# Patient Record
Sex: Female | Born: 1965 | Race: White | Hispanic: No | Smoking: Never smoker
Health system: Southern US, Community
[De-identification: ages and names within clinical notes are randomized; demographics above are authoritative.]

## PROBLEM LIST (undated history)

## (undated) DIAGNOSIS — F419 Anxiety disorder, unspecified: Secondary | ICD-10-CM

## (undated) DIAGNOSIS — E119 Type 2 diabetes mellitus without complications: Secondary | ICD-10-CM

## (undated) DIAGNOSIS — Z0000 Encounter for general adult medical examination without abnormal findings: Secondary | ICD-10-CM

## (undated) DIAGNOSIS — M810 Age-related osteoporosis without current pathological fracture: Secondary | ICD-10-CM

## (undated) DIAGNOSIS — R109 Unspecified abdominal pain: Secondary | ICD-10-CM

## (undated) DIAGNOSIS — R21 Rash and other nonspecific skin eruption: Secondary | ICD-10-CM

## (undated) DIAGNOSIS — J209 Acute bronchitis, unspecified: Secondary | ICD-10-CM

## (undated) DIAGNOSIS — T7840XA Allergy, unspecified, initial encounter: Secondary | ICD-10-CM

## (undated) DIAGNOSIS — E785 Hyperlipidemia, unspecified: Secondary | ICD-10-CM

## (undated) DIAGNOSIS — E039 Hypothyroidism, unspecified: Secondary | ICD-10-CM

## (undated) HISTORY — DX: Age-related osteoporosis without current pathological fracture: M81.0

## (undated) HISTORY — DX: Encounter for general adult medical examination without abnormal findings: Z00.00

## (undated) HISTORY — DX: Hypothyroidism, unspecified: E03.9

## (undated) HISTORY — DX: Hyperlipidemia, unspecified: E78.5

## (undated) HISTORY — DX: Type 2 diabetes mellitus without complications: E11.9

## (undated) HISTORY — DX: Acute bronchitis, unspecified: J20.9

## (undated) HISTORY — DX: Rash and other nonspecific skin eruption: R21

## (undated) HISTORY — DX: Allergy, unspecified, initial encounter: T78.40XA

## (undated) HISTORY — DX: Anxiety disorder, unspecified: F41.9

## (undated) HISTORY — DX: Unspecified abdominal pain: R10.9

## (undated) HISTORY — PX: NO PAST SURGERIES: SHX2092

---

## 2008-08-30 ENCOUNTER — Encounter: Admission: RE | Admit: 2008-08-30 | Discharge: 2008-08-30 | Payer: Self-pay | Admitting: Endocrinology

## 2009-04-30 ENCOUNTER — Encounter: Payer: Self-pay | Admitting: Family Medicine

## 2009-12-05 ENCOUNTER — Ambulatory Visit: Payer: Self-pay | Admitting: Family Medicine

## 2009-12-05 DIAGNOSIS — L989 Disorder of the skin and subcutaneous tissue, unspecified: Secondary | ICD-10-CM | POA: Insufficient documentation

## 2009-12-05 DIAGNOSIS — Z780 Asymptomatic menopausal state: Secondary | ICD-10-CM | POA: Insufficient documentation

## 2009-12-05 DIAGNOSIS — E559 Vitamin D deficiency, unspecified: Secondary | ICD-10-CM | POA: Insufficient documentation

## 2009-12-05 DIAGNOSIS — M4120 Other idiopathic scoliosis, site unspecified: Secondary | ICD-10-CM | POA: Insufficient documentation

## 2009-12-05 DIAGNOSIS — M810 Age-related osteoporosis without current pathological fracture: Secondary | ICD-10-CM | POA: Insufficient documentation

## 2009-12-05 DIAGNOSIS — T7840XA Allergy, unspecified, initial encounter: Secondary | ICD-10-CM | POA: Insufficient documentation

## 2009-12-05 DIAGNOSIS — R110 Nausea: Secondary | ICD-10-CM | POA: Insufficient documentation

## 2009-12-05 DIAGNOSIS — R1032 Left lower quadrant pain: Secondary | ICD-10-CM | POA: Insufficient documentation

## 2009-12-05 DIAGNOSIS — E782 Mixed hyperlipidemia: Secondary | ICD-10-CM | POA: Insufficient documentation

## 2009-12-05 DIAGNOSIS — E119 Type 2 diabetes mellitus without complications: Secondary | ICD-10-CM | POA: Insufficient documentation

## 2009-12-05 HISTORY — DX: Age-related osteoporosis without current pathological fracture: M81.0

## 2009-12-06 ENCOUNTER — Encounter: Admission: RE | Admit: 2009-12-06 | Discharge: 2009-12-06 | Payer: Self-pay | Admitting: Family Medicine

## 2010-02-05 ENCOUNTER — Ambulatory Visit: Payer: Self-pay | Admitting: Family Medicine

## 2010-02-26 ENCOUNTER — Encounter: Payer: Self-pay | Admitting: Family Medicine

## 2010-02-26 ENCOUNTER — Encounter
Admission: RE | Admit: 2010-02-26 | Discharge: 2010-02-26 | Payer: Self-pay | Source: Home / Self Care | Attending: Family Medicine | Admitting: Family Medicine

## 2010-02-26 LAB — HM MAMMOGRAPHY

## 2010-03-14 ENCOUNTER — Telehealth (INDEPENDENT_AMBULATORY_CARE_PROVIDER_SITE_OTHER): Payer: Self-pay | Admitting: *Deleted

## 2010-03-15 ENCOUNTER — Encounter: Payer: Self-pay | Admitting: Family Medicine

## 2010-03-19 ENCOUNTER — Encounter: Payer: Self-pay | Admitting: Family Medicine

## 2010-03-19 NOTE — Letter (Signed)
Summary: Records from [facility] 2010 - 2011  Records from [facility] 2010 - 2011   Imported By: Maryln Gottron 12/31/2009 14:18:41  _____________________________________________________________________  External Attachment:    Type:   Image     Comment:   External Document

## 2010-03-19 NOTE — Assessment & Plan Note (Signed)
Summary: to be est/abd pain/njr   Vital Signs:  Patient profile:   46 year old female Height:      61 inches Weight:      104 pounds BMI:     19.72 O2 Sat:      98 % Temp:     98.2 degrees F oral Pulse rate:   86 / minute BP sitting:   104 / 60  (left arm)  Vitals Entered By: g hudy rn CC: c/o LLQ pain denies constipation no diarrhea onset 1 month ago    History of Present Illness: Patient in today for new patient appt. Her major complaint today is LLQ pain. he has a 45 year old nonsmoking Caucasian female who has been having in the left lower quadrant for roughly one month. She reports the pain waxed and waned it resolves it is very low in the leftquadrant she denies any recent change in bowel habits. Moves her bowels daily, formed and comfortably. Stool are brown and unchanged, pain does not change with defecation. Only urinary symptom is change in urine color to almost light greenish, no dysuria, hematuria, frequency or urgency. she reports a long history of intermittent cyclic left lower quadrant pain often noting its worse in the middle of the month in between periods. He sees an OB/GYN was not having this persistent pain when she saw him last in May. At times the pain goes to 10 out of 10 and today is at 5. Position changes do not affect it. No radicular symptoms down the leg and no back pain. No recent illness, fevers, chills, CP, SOB. She denies any knowledge of having numerous symptoms consistent with perimenopause, including night sweats, palpitations, mood swings, insomnia, forgetfulness. Reports a recent [facility] was 47.4 but she is unclear what time of the month at once. In her early 54s she was diagnosed with multiplemedical problems including diabetes unclear if it is type I or type II she is tight control. She does see an endocrinologist. She has hyperlipidemia and has been started on Zocor. She is bad allergies and recently started allergy shots of malignancies are helping. She has  scoliosis in her neck and has chronic neck pain. Osteopenia was diagnosed and she is now 4  Preventive Screening-Counseling & Management  Alcohol-Tobacco     Smoking Status: never  Caffeine-Diet-Exercise     Does Patient Exercise: yes  Allergies (verified): 1)  ! Pcn 2)  ! 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  The patient denies anorexia, fever, weight loss, weight gain, vision loss, decreased hearing, hoarseness, chest pain, syncope, dyspnea on exertion, peripheral edema, prolonged cough, headaches, hemoptysis, melena, hematochezia, severe indigestion/heartburn, hematuria, incontinence, genital sores, muscle weakness, suspicious skin lesions, transient blindness, difficulty walking, depression, unusual weight change, abnormal bleeding, enlarged lymph nodes, and breast masses.  Physical Exam  General:  Well-developed,well-nourished,in no acute distress; alert,appropriate and cooperative throughout examination Head:  Normocephalic and atraumatic without obvious abnormalities. No apparent alopecia or balding. Eyes:  No corneal or conjunctival inflammation noted. EOMI. Perrla.  Ears:   External ear exam shows no significant lesions or deformities.  Otoscopic examination reveals clear canals, tympanic membranes are intact bilaterally without bulging, retraction, inflammation or discharge. Hearing is grossly normal bilaterally. Nose:  External nasal examination shows no deformity or inflammation. Nasal mucosa are pink and moist without lesions or exudates. Mouth:  Oral mucosa and oropharynx without lesions or exudates.  Teeth in good repair. Neck:  No deformities, masses, or tenderness noted. Lungs:  Normal respiratory effort, chest expands symmetrically. Lungs are clear to auscultation, no crackles or wheezes. Heart:  Normal rate and regular rhythm. S1 and S2 normal without gallop, murmur, click, rub or other extra sounds. Abdomen:  Bowel sounds positive,abdomen soft and organomegaly or hernias noted.no guarding, no rigidity, no rebound tenderness, and no inguinal hernia.  Is mildly tender to palpation in very lower area of left lower quadrant, feel some mild tension noted in a superficial muscle in this region, no discreet mass noted Msk:  No deformity noted of thoracic or lumbar spine.   Pulses:  R and L carotid, dorsalis pedis and posterior tibial pulses are full and equal bilaterally Extremities:  No clubbing, cyanosis, edema, or deformity noted with normal full range of motion of all joints.   Neurologic:  No cranial nerve deficits noted. Station and gait are normal. Plantar reflexes are down-going bilaterally. DTRs are symmetrical throughout. Sensory, motor and coordinative functions appear intact. Skin:  Intact without suspicious lesions or rashes. Plenty of freckles and benign appearing skin lesions Cervical Nodes:  No lymphadenopathy noted Psych:  Cognition and judgment appear intact. Alert and cooperative with normal attention span and concentration. No apparent delusions, illusions, hallucinations   Impression & Recommendations:  Problem # 1:  ABDOMINAL PAIN, LEFT LOWER  QUADRANT (ICD-789.04)  Orders: UA Dipstick w/o Micro (automated)  (81003) Radiology Referral (Radiology) Will proceed with LLQ [place], r/o ovarian pathology, apply heat and or ice for pain relief, may apply Aspercreme as needed and OTC Aleve 220mg  by mouth two times a day for next couple of weeks and then as needed with food, if pain does not resolve will proceed with further imaging including a left hip xray  Problem # 2:  DIABETES MELLITUS (ICD-250.00) Diet controlled, is following with Endocrinology and will continue to do so but will request records just to verify which labs they order routinely so we can monitor CBC, hepatic panel etc if not routinely ordered. No changes recommended at today's visit  Problem # 3:  OSTEOPENIA (ICD-733.90)  Her updated medication list for this problem includes:    Actonel 35 Mg Tabs (Risedronate sodium) .Marland Kitchen... Weekly dr Lenord Carbo Is maintained on this by her OB/GYN consider a switch from Caltrate to Citracal for better absorption  Problem # 4:  ALLERGY (ICD-995.3) Receiving routine allergy shots at present and does believe these are helping.  Problem # 5:  PERIMENOPAUSAL STATUS (ICD-V49.81) Encouraged 8 hour sleep, maintain exercise, do not skip meals or eat excessive carbs, start Fish oil 1 cap daily and consider Borage Oil if symptoms do not improve. Patient is considering Bioidentical hormones, should discuss with gynecologist. May consider Black Cohosh supplement  Problem # 6:  SKIN LESIONS, MULTIPLE (ICD-709.9) All benign appearing but she already has an appt for annual skin mapping with a Dermatologist  due to her family history of melanoma and history of sun exposure.  Problem # 7:  SCOLIOSIS (ICD-737.30) Neck pain, try moist heat and gentle stretching  Complete Medication List: 1)  Actonel 35 Mg Tabs (Risedronate sodium) .... Weekly dr Lenord Carbo 2)  Zocor 20 Mg Tabs (Simvastatin) 3)  Loestrin 1.5/30 (21) 1.5-30 Mg-mcg Tabs (Norethindrone acet-ethinyl  est) 4)  Levocetirize  5)  Estrace 0.1 Mg/gm Crea (Estradiol)  Patient Instructions: 1)  Please schedule a follow-up appointment in 2 months.  2)  For left lower quadrant pain, try moist heat and massage with Aspercreme at least daily if not two times a day  3)  Take Aleve 220mg  by mouth two times a day over the next 2 weeks. 4)  Release of Record, Dr  Horald Pollen, Endocrinology 5)  If Pelvic US unremarkable and pain continues call in roughly 2 weeks and we will send you for an xray   Orders Added: 1)  UA Dipstick w/o Micro (automated)  [81003] 2)  Radiology Referral [Radiology] 3)  New Patient Level IV [16109]  Appended Document: Orders Update    Clinical Lists Changes  Observations: Added new observation of COMMENTS: Wynona Canes, CMA  December 05, 2009 3:05 PM  (12/05/2009 15:04) Added new observation of PH URINE: 6.0  (12/05/2009 15:04) Added new observation of SPEC GR URIN: 1.010  (12/05/2009 15:04) Added new observation of APPEARANCE U: Clear  (12/05/2009 15:04) Added new observation of UA COLOR: yellow  (12/05/2009 15:04) Added new observation of WBC DIPSTK U: negative  (12/05/2009 15:04) Added new observation of NITRITE URN: negative  (12/05/2009 15:04) Added new observation of UROBILINOGEN: 0.2  (12/05/2009 15:04) Added new observation of PROTEIN, URN: negative  (12/05/2009 15:04) Added new observation of BLOOD UR DIP: negative  (12/05/2009 15:04) Added new observation of KETONES URN: negative  (12/05/2009 15:04) Added new observation of BILIRUBIN UR: negative  (12/05/2009 15:04) Added new observation of GLUCOSE, URN: negative  (12/05/2009 15:04)      Laboratory Results   Urine Tests  Date/Time Recieved: December 05, 2009 3:05 PM  Date/Time Reported: December 05, 2009 3:04 PM   Routine Urinalysis   Color: yellow Appearance: Clear Glucose: negative   (Normal Range: Negative) Bilirubin: negative   (Normal Range: Negative) Ketone: negative   (Normal Range:  Negative) Spec. Gravity: 1.010   (Normal Range: 1.003-1.035) Blood: negative   (Normal Range: Negative) pH: 6.0   (Normal Range: 5.0-8.0) Protein: negative   (Normal Range: Negative) Urobilinogen: 0.2   (Normal Range: 0-1) Nitrite: negative   (Normal Range: Negative) Leukocyte Esterace: negative   (Normal Range: Negative)    Comments: Wynona Canes, CMA  December 05, 2009 3:05 PM    Notify neg

## 2010-03-21 ENCOUNTER — Encounter: Payer: Self-pay | Admitting: Family Medicine

## 2010-03-21 NOTE — Progress Notes (Signed)
Summary: Actonel question  Phone Note Call from Patient Call back at Work Phone [phone]   Caller: Patient Reason for Call: Talk to Nurse Summary of Call: Pt has question about actonel, can reach her after 4PM Initial call taken by: Lannette Donath,  March 14, 2010 4:06 PM  Follow-up for Phone Call        Will call pt back. Have been trying to reach pt in previous note about the Actonel. Follow-up by: Josph Macho RMA,  March 15, 2010 8:28 AM

## 2010-03-21 NOTE — Assessment & Plan Note (Signed)
Summary: 2 month f/u//alp rsch from bmp/dt   Vital Signs:  Patient profile:   45 year old female Height:      61 inches (154.94 cm) Weight:      104.50 pounds (47.50 kg) O2 Sat:      99 % on Room air Temp:     97.9 degrees F (36.61 degrees C) oral Pulse rate:   73 / minute BP sitting:   106 / 69  (right arm) Cuff size:   regular  Vitals Entered By: Josph Macho RMA (February 05, 2010 10:28 AM)  O2 Flow:  Room air CC: 2 month follow up/ CF Is Patient Diabetic? Yes   History of Present Illness: Patient a 89 she'll female in today for follow up on a new patient appointment. She reports doing well. She has had no recent illness, fevers, congestion, chest pain, palpitations, shortness of breath, GI or GU concerns. She's recently seen her endocrinologist and her hemoglobin A1c was 5.0. She does not believe they checked her cholesterol recently but does believe her scheduled blood work next year will include a cholesterol. She is in need of a bone densitometry and mammogram I would like him to be order to breathe imaging this year is that of having to return to [facility] she [facility] she had her ultrasound on him like to return there. She's not had any persistent abdominal pain she does occasionally still have some intermittent left hip pain. She denies any pattern to the pain no particular movements or activities and to bring it on no particular time of the month no change in bowel habits as noted. She does note she has some intermittent radicular symptoms with a burning sensation down her lateral left hip at times into the lateral aspect of her thigh. She described as burning with the massage it resolves. She denies any low back pain. Does have ongoing trouble with her cervical spine for long history of scoliosis and chronic neck pain but was in physical therapy her neck is relatively immobile and the pain is tolerable  at this time.  Current Medications (verified): 1)  Actonel 35 Mg Tabs (Risedronate Sodium) .... Weekly Dr Lenord Carbo 2)  Zocor 20 Mg Tabs (Simvastatin) 3)  Loestrin 1.5/30 (21) 1.5-30 Mg-Mcg Tabs (Norethindrone Acet-Ethinyl Est) 4)  Levocetirize 5)  Estrace 0.1 Mg/gm Crea (Estradiol)  Allergies (verified): 1)  ! Pcn 2)  ! Codeine  Past History:  Past medical history reviewed for relevance to current acute and chronic problems. Social history (including risk factors) reviewed for relevance to current acute and chronic problems.  Social History: Reviewed history from 12/05/2009 and no changes required. 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 No apparent alopecia or balding. Mouth:  Oral mucosa and oropharynx without lesions or exudates.  Teeth in good repair. Neck:  No deformities, masses, or tenderness noted. Lungs:  Normal respiratory effort, chest expands symmetrically. Lungs are clear to auscultation, no crackles or wheezes. Heart:  Normal rate and regular rhythm. S1 and S2 normal without gallop, murmur, click, rub or other extra sounds. Abdomen:  Bowel sounds  positive,abdomen soft and non-tender without masses, organomegaly or hernias noted. Extremities:  No clubbing, cyanosis, edema, or deformity noted with normal full range of motion of all joints.   Cervical Nodes:  No lymphadenopathy noted Psych:  Cognition and judgment appear intact. Alert and cooperative with normal attention span and concentration. No apparent delusions, illusions,  hallucinations   Impression & Recommendations:  Problem # 1:  ABDOMINAL PAIN, LEFT LOWER QUADRANT (ICD-789.04) Improved, occuring much less frequently and responding to Ibuprofen as needed when it occurs. Pain more suggestive of musculoskeletal dysfunction than abdominal. She will call if pain worsens for futher imaging and possible referral. Pelvic US unremarkable  Problem # 2:  MIXED HYPERLIPIDEMIA (ICD-272.2)  Her updated medication list for this problem includes:    Zocor 20 Mg Tabs (Simvastatin) Is tolerating Zocor and she believes she has an appt to have cholesterol checked already next year. She will call if this does not get performed. Avoid trans fats, increase exercise. Reviewed last FLP with patient  Problem # 3:  OSTEOPENIA (ICD-733.90)  Her updated medication list for this problem includes:    Actonel 35 Mg Tabs (Risedronate sodium) .Marland Kitchen... Weekly dr Suzan Nailer densitometry ordered today, will have Vit D level checked at her Endocrinologists office, will have results forwarded here.  Orders: Radiology Referral (Radiology)  Problem # 4:  DIABETES MELLITUS (ICD-250.00) Patient reports HGBA1C 5.0 recently, patient agrees to continue same dietary regimen  Problem # 5:  Screening Breast Cancer (ICD-V76.10) Patient requesting that her screening MGM be completed at the [facility] in [place], written prescription provided  Complete Medication List: 1)  Actonel 35 Mg Tabs (Risedronate sodium) .... Weekly dr Lenord Carbo 2)  Zocor 20 Mg Tabs (Simvastatin) 3)  Loestrin 1.5/30 (21) 1.5-30 Mg-mcg Tabs (Norethindrone acet-ethinyl est) 4)  Levocetirize  5)  Estrace 0.1 Mg/gm Crea (Estradiol)  Patient Instructions: 1)  Please schedule a follow-up appointment in 10 months for annual exam. Call with any concerns   Orders Added: 1)  Radiology Referral [Radiology] 2)  Est. Patient Level IV [95621]    Preventive Care Screening  Last Flu Shot:    Date:  01/17/2010    Results:   historical

## 2010-03-27 NOTE — Letter (Signed)
Summary: Generic Letter  [place] [facility]  [address]   [place], [place] [address]   Phone: [phone]  Fax: [phone]    03/21/2010    Dalene Carrow [address] [place], [place]  [address]  Dear Ms. Amalia Hailey,   Our office is having trouble contacting you about some questions you had concerning your labs. Will you please call our office at your earliest convenience [phone].      Sincerely,   Josph Macho RMA

## 2010-04-25 NOTE — Letter (Signed)
Summary: [facility] MD  [facility] MD   Imported By: Lester Newport 04/19/2010 07:47:10  _____________________________________________________________________  External Attachment:    Type:   Image     Comment:   External Document

## 2010-07-16 ENCOUNTER — Ambulatory Visit (INDEPENDENT_AMBULATORY_CARE_PROVIDER_SITE_OTHER): Payer: 59 | Admitting: Family Medicine

## 2010-07-16 ENCOUNTER — Encounter: Payer: Self-pay | Admitting: Family Medicine

## 2010-07-16 VITALS — BP 102/66 | HR 75 | Temp 98.0°F | Ht 61.0 in | Wt 104.0 lb

## 2010-07-16 DIAGNOSIS — N390 Urinary tract infection, site not specified: Secondary | ICD-10-CM | POA: Insufficient documentation

## 2010-07-16 DIAGNOSIS — R300 Dysuria: Secondary | ICD-10-CM

## 2010-07-16 LAB — POCT URINALYSIS DIPSTICK
Glucose, UA: NEGATIVE
Ketones, UA: 80
Nitrite, UA: NEGATIVE
Protein, UA: 30
Spec Grav, UA: 1.03
Urobilinogen, UA: 0.2
pH, UA: 5.5

## 2010-07-16 MED ORDER — CIPROFLOXACIN HCL 500 MG PO TABS: 500.0000 mg | ORAL_TABLET | Freq: Two times a day (BID) | ORAL | Status: AC

## 2010-07-16 NOTE — Assessment & Plan Note (Signed)
Start cipro 500mg  bid x 5d. Sent urine for c/s. Discussed prn use of OTC AZO over the next 1-2 days. Call or return if not showing improvement in 2d.

## 2010-07-16 NOTE — Progress Notes (Signed)
OFFICE NOTE  07/16/2010  CC:  Chief Complaint  Patient presents with  . Urinary Tract Infection    urinary burning, bloody urine, urgency, frequency     HPI:   Patient is a 45 y.o. Caucasian female who is here for UTI sx's. Reports 2-3 days of persistent dysuria, urinary urgency and frequency, and intermittent gross hematuria.   No fever, no abd pain, no nausea, no flank pain.  No meds taken for these symptoms. Says last UTI was probably > 5 yrs ago.  Pertinent PMH:  DM 2, diet controlled Perimenopausal syndrome Osteopenia  MEDS;   Outpatient Prescriptions Prior to Visit  Medication Sig Dispense Refill  . estradiol (ESTRACE) 0.1 MG/GM vaginal cream Place 2 g vaginally daily.        . risedronate (ACTONEL) 35 MG tablet Take 35 mg by mouth once a week. with water on empty stomach, nothing by mouth or lie down for next 30 minutes.         PE: Blood pressure 102/66, pulse 75, temperature 98 F (36.7 C), temperature source Oral, height 5\' 1"  (1.549 m), weight 104 lb (47.174 kg), last menstrual period 07/02/2010. Gen: Alert, well appearing.  Patient is oriented to person, place, time, and situation. Chest: symmetric expansion, nonlabored respirations.  Clear and equal breath sounds in all lung fields.   CV: RRR, no m/r/g.   LAB: CC UA today showed some leukocytes and blood, otherwise normal.  IMPRESSION AND PLAN:  UTI (lower urinary tract infection) Start cipro 500mg  bid x 5d. Sent urine for c/s. Discussed prn use of OTC AZO over the next 1-2 days. Call or return if not showing improvement in 2d.     FOLLOW UP:  Return if symptoms worsen or fail to improve.

## 2010-07-19 LAB — URINE CULTURE: Colony Count: >=100000

## 2010-12-11 ENCOUNTER — Encounter: Payer: Self-pay | Admitting: Family Medicine

## 2010-12-11 ENCOUNTER — Ambulatory Visit (INDEPENDENT_AMBULATORY_CARE_PROVIDER_SITE_OTHER): Payer: 59 | Admitting: Family Medicine

## 2010-12-11 VITALS — BP 120/75 | HR 74 | Temp 97.9°F | Ht 61.0 in | Wt 107.1 lb

## 2010-12-11 DIAGNOSIS — J209 Acute bronchitis, unspecified: Secondary | ICD-10-CM

## 2010-12-11 DIAGNOSIS — E119 Type 2 diabetes mellitus without complications: Secondary | ICD-10-CM

## 2010-12-11 DIAGNOSIS — J40 Bronchitis, not specified as acute or chronic: Secondary | ICD-10-CM

## 2010-12-11 DIAGNOSIS — E782 Mixed hyperlipidemia: Secondary | ICD-10-CM

## 2010-12-11 MED ORDER — ALBUTEROL SULFATE HFA 108 (90 BASE) MCG/ACT IN AERS: 2.0000 | INHALATION_SPRAY | Freq: Four times a day (QID) | RESPIRATORY_TRACT | Status: DC | PRN

## 2010-12-11 MED ORDER — METHYLPREDNISOLONE 4 MG PO KIT: PACK | ORAL | Status: DC

## 2010-12-11 MED ORDER — CEFDINIR 300 MG PO CAPS: 300.0000 mg | ORAL_CAPSULE | Freq: Two times a day (BID) | ORAL | Status: AC

## 2010-12-11 MED ORDER — HYDROCOD POLST-CHLORPHEN POLST 10-8 MG/5ML PO LQCR: 5.0000 mL | Freq: Every evening | ORAL | Status: DC | PRN

## 2010-12-11 NOTE — Patient Instructions (Signed)

## 2010-12-15 ENCOUNTER — Encounter: Payer: Self-pay | Admitting: Family Medicine

## 2010-12-15 DIAGNOSIS — J209 Acute bronchitis, unspecified: Secondary | ICD-10-CM

## 2010-12-15 HISTORY — DX: Acute bronchitis, unspecified: J20.9

## 2010-12-15 NOTE — Assessment & Plan Note (Signed)
No recent concerning symptoms. 

## 2010-12-15 NOTE — Progress Notes (Signed)
Kelli Barry [id number] 05/16/1965 12/15/2010      Progress Note-Follow Up  Subjective  Chief Complaint  Chief Complaint  Patient presents with  . Nasal Congestion    X 6 weeks- moved into chest  . difficulty breathing    HPI  Patient is a 45 year old Caucasian female who is in today with 6 or more weeks of ongoing respiratory symptoms she started with a sore throat and quickly developed headache and nasal congestion. She managed her symptoms for several weeks and then eventually some chills, malaise, myalgias, fatigue worsened. Cough became bad enough that is now keeping her up at night. Generally dry occasionally productive he'll sputum. She notes postnasal drip and chills have also developed. She feels short of breath and easily winded. She has tried some Mucinex over-the-counter with minimal results.  Past Medical History  Diagnosis Date  . Acute bronchitis 12/15/2010    History reviewed. No pertinent past surgical history.  Family History  Problem Relation Age of Onset  . Diabetes Father     DM II  . Hypertension Father   . Melanoma Father   . Hypertension Mother   . Fibroids Mother     uterine  . Ovarian cysts Sister   . Cancer Maternal Grandmother     colon  . Coronary artery disease Maternal Grandmother   . Heart attack Maternal Grandmother   . Heart disease Maternal Grandfather   . Stroke Paternal Grandmother   . Dementia Paternal Grandmother   . Cancer Paternal Grandfather     Lung  . Emphysema Paternal Grandfather   . Allergies Son     History   Social History  . Marital Status: Married    Spouse Name: N/A    Number of Children: N/A  . Years of Education: N/A   Occupational History  . Not on file.   Social History Main Topics  . Smoking status: Never Smoker   . Smokeless tobacco: Never Used  . Alcohol Use: 0.0 oz/week    0 drink(s) per week  . Drug Use: Not on file  . Sexually Active: Not on file   Other Topics Concern  . Not on file     Social History Narrative  . No narrative on file    Current Outpatient Prescriptions on File Prior to Visit  Medication Sig Dispense Refill  . estradiol (ESTRACE) 0.1 MG/GM vaginal cream Place 2 g vaginally daily.        Marland Kitchen levocetirizine (XYZAL) 5 MG tablet Take 5 mg by mouth every evening.        . Norethin Ace-Eth Estrad-FE (LOESTRIN FE 1/20 PO) Take 1 tablet by mouth daily.        . risedronate (ACTONEL) 35 MG tablet Take 35 mg by mouth once a week. with water on empty stomach, nothing by mouth or lie down for next 30 minutes.       . simvastatin (ZOCOR) 20 MG tablet Take 20 mg by mouth at bedtime.          Allergies  Allergen Reactions  . Codeine   . Penicillins     Review of Systems  Review of Systems  Constitutional: Positive for malaise/fatigue. Negative for fever and chills.  HENT: Positive for congestion and sore throat. Negative for hearing loss and nosebleeds.   Eyes: Negative for discharge.  Respiratory: Positive for cough, sputum production, shortness of breath and wheezing.   Cardiovascular: Negative for chest pain, palpitations, orthopnea and leg swelling.  Gastrointestinal: Negative for heartburn,  nausea, vomiting, abdominal pain, diarrhea, constipation and blood in stool.  Genitourinary: Negative for dysuria, urgency, frequency and hematuria.  Musculoskeletal: Positive for myalgias. Negative for back pain and falls.  Skin: Negative for rash.  Neurological: Positive for headaches. Negative for dizziness, tremors, sensory change, focal weakness, loss of consciousness and weakness.  Endo/Heme/Allergies: Negative for polydipsia. Does not bruise/bleed easily.  Psychiatric/Behavioral: Negative for depression and suicidal ideas. The patient is not nervous/anxious and does not have insomnia.     Objective  BP 120/75  Pulse 74  Temp(Src) 97.9 F (36.6 C) (Oral)  Ht 5\' 1"  (1.549 m)  Wt 107 lb 1.9 oz (48.589 kg)  BMI 20.24 kg/m2  SpO2 99%  LMP  11/13/2010  Physical Exam  Physical Exam  Constitutional: She is oriented to person, place, and time and well-developed, well-nourished, and in no distress. No distress.  HENT:  Head: Normocephalic and atraumatic.       Nasal mucosa boggy and erythematous  Eyes: Conjunctivae are normal.  Neck: Neck supple. No thyromegaly present.  Cardiovascular: Normal rate, regular rhythm and normal heart sounds.   No murmur heard. Pulmonary/Chest: Effort normal and breath sounds normal. She has no wheezes. She exhibits no tenderness.       Decreased breath sounds b/l bases  Abdominal: She exhibits no distension and no mass.  Musculoskeletal: She exhibits no edema.  Lymphadenopathy:    She has no cervical adenopathy.  Neurological: She is alert and oriented to person, place, and time. No cranial nerve deficit.  Skin: Skin is warm and dry. No rash noted. She is not diaphoretic.  Psychiatric: Memory, affect and judgment normal.     Assessment & Plan  DIABETES MELLITUS No recent concerning symptoms  MIXED HYPERLIPIDEMIA Tolerating Simvastatin, avoid trans fats  Acute bronchitis Increase rest and hydration, Start Cefdinir, Medrol dose pak and Albuterol  prn    2

## 2010-12-15 NOTE — Assessment & Plan Note (Signed)
Increase rest and hydration, Start Cefdinir, Medrol dose pak and Albuterol  prn

## 2010-12-15 NOTE — Assessment & Plan Note (Signed)
Tolerating Simvastatin, avoid trans fats.  

## 2010-12-17 ENCOUNTER — Ambulatory Visit (INDEPENDENT_AMBULATORY_CARE_PROVIDER_SITE_OTHER): Payer: 59 | Admitting: Family Medicine

## 2010-12-17 ENCOUNTER — Encounter: Payer: Self-pay | Admitting: Family Medicine

## 2010-12-17 ENCOUNTER — Ambulatory Visit (HOSPITAL_BASED_OUTPATIENT_CLINIC_OR_DEPARTMENT_OTHER)
Admission: RE | Admit: 2010-12-17 | Discharge: 2010-12-17 | Disposition: A | Discharge status: Home / Self Care | Payer: 59 | Source: Ambulatory Visit | Attending: Family Medicine | Admitting: Family Medicine

## 2010-12-17 DIAGNOSIS — J209 Acute bronchitis, unspecified: Secondary | ICD-10-CM

## 2010-12-17 DIAGNOSIS — J40 Bronchitis, not specified as acute or chronic: Secondary | ICD-10-CM

## 2010-12-17 DIAGNOSIS — R0602 Shortness of breath: Secondary | ICD-10-CM

## 2010-12-17 DIAGNOSIS — R059 Cough, unspecified: Secondary | ICD-10-CM

## 2010-12-17 DIAGNOSIS — R05 Cough: Secondary | ICD-10-CM | POA: Insufficient documentation

## 2010-12-17 MED ORDER — FLUTICASONE-SALMETEROL 45-21 MCG/ACT IN AERO: 1.0000 | INHALATION_SPRAY | Freq: Two times a day (BID) | RESPIRATORY_TRACT | Status: DC

## 2010-12-17 NOTE — Patient Instructions (Signed)

## 2010-12-17 NOTE — Progress Notes (Signed)
Lorriane Dehart [id number] 1965-03-15 12/17/2010      Progress Note-Follow Up  Subjective  Chief Complaint  Chief Complaint  Patient presents with  . Fatigue    very tired still  . Shortness of Breath    hard to catch breath  . Cough    w/ phlegm (clear and sometimes yellow)    HPI  Patient is a 45 year old Caucasian female who is in today with persistent cough. Last week she was seen with acute bronchitis, cough, fatigue, malaise, sputum. Was started on Omnicef, and insulin and steroids. The steroids have been completed but she continues to cough have fatigue and sputum. Albuterol does give relief and allows her to sleep. She is hepatitis next 2 to concerns about sedation. No fevers, chills, headache, ear pain. Has a mild scratchy throat. She has chest wall pain when she coughs but no other time. No palpitations or wheezing. Does have some mild shortness of breath. NO GI or GU c/o  Past Medical History  Diagnosis Date  . Acute bronchitis 12/15/2010    No past surgical history on file.  Family History  Problem Relation Age of Onset  . Diabetes Father     DM II  . Hypertension Father   . Melanoma Father   . Hypertension Mother   . Fibroids Mother     uterine  . Ovarian cysts Sister   . Cancer Maternal Grandmother     colon  . Coronary artery disease Maternal Grandmother   . Heart attack Maternal Grandmother   . Heart disease Maternal Grandfather   . Stroke Paternal Grandmother   . Dementia Paternal Grandmother   . Cancer Paternal Grandfather     Lung  . Emphysema Paternal Grandfather   . Allergies Son     History   Social History  . Marital Status: Married    Spouse Name: N/A    Number of Children: N/A  . Years of Education: N/A   Occupational History  . Not on file.   Social History Main Topics  . Smoking status: Never Smoker   . Smokeless tobacco: Never Used  . Alcohol Use: 0.0 oz/week    0 drink(s) per week  . Drug Use: Not on file  . Sexually  Active: Not on file   Other Topics Concern  . Not on file   Social History Narrative  . No narrative on file    Current Outpatient Prescriptions on File Prior to Visit  Medication Sig Dispense Refill  . albuterol (VENTOLIN HFA) 108 (90 BASE) MCG/ACT inhaler Inhale 2 puffs into the lungs every 6 (six) hours as needed for wheezing.  1 Inhaler  0  . cefdinir (OMNICEF) 300 MG capsule Take 1 capsule (300 mg total) by mouth 2 (two) times daily.  28 capsule  0  . estradiol (ESTRACE) 0.1 MG/GM vaginal cream Place 2 g vaginally daily.        Marland Kitchen levocetirizine (XYZAL) 5 MG tablet Take 5 mg by mouth every evening.        . Norethin Ace-Eth Estrad-FE (LOESTRIN FE 1/20 PO) Take 1 tablet by mouth daily.        . risedronate (ACTONEL) 35 MG tablet Take 35 mg by mouth once a week. with water on empty stomach, nothing by mouth or lie down for next 30 minutes.       . simvastatin (ZOCOR) 20 MG tablet Take 20 mg by mouth at bedtime.        . chlorpheniramine-HYDROcodone (TUSSIONEX PENNKINETIC  ER) 10-8 MG/5ML LQCR Take 5 mLs by mouth at bedtime as needed.  480 mL  1    Allergies  Allergen Reactions  . Codeine   . Penicillins     Review of Systems  Review of Systems  Constitutional: Negative for fever, chills and malaise/fatigue.  HENT: Positive for congestion.   Eyes: Negative for discharge.  Respiratory: Positive for cough, sputum production and shortness of breath. Negative for wheezing.   Cardiovascular: Negative for chest pain, palpitations and leg swelling.  Gastrointestinal: Negative for nausea, abdominal pain and diarrhea.  Genitourinary: Negative for dysuria.  Musculoskeletal: Negative for falls.  Skin: Negative for rash.  Neurological: Negative for loss of consciousness and headaches.  Endo/Heme/Allergies: Negative for polydipsia.  Psychiatric/Behavioral: Negative for depression and suicidal ideas. The patient is not nervous/anxious and does not have insomnia.     Objective  BP  125/76  Pulse 67  Temp(Src) 98.2 F (36.8 C) (Oral)  Ht 5\' 1"  (1.549 m)  Wt 106 lb 12.8 oz (48.444 kg)  BMI 20.18 kg/m2  SpO2 100%  LMP 11/13/2010  Physical Exam  Physical Exam  Constitutional: She is oriented to person, place, and time and well-developed, well-nourished, and in no distress. No distress.  HENT:  Head: Normocephalic and atraumatic.  Eyes: Conjunctivae are normal.  Neck: Neck supple. No thyromegaly present.  Cardiovascular: Normal rate, regular rhythm and normal heart sounds.   No murmur heard. Pulmonary/Chest: Effort normal and breath sounds normal. She has no wheezes.  Abdominal: She exhibits no distension and no mass.  Musculoskeletal: She exhibits no edema.  Lymphadenopathy:    She has no cervical adenopathy.  Neurological: She is alert and oriented to person, place, and time.  Skin: Skin is warm and dry. No rash noted. She is not diaphoretic.  Psychiatric: Memory, affect and judgment normal.      Assessment & Plan  Acute bronchitis Symptoms persistent despite just finishing a course of Steroids and continuing her antibiotics, she continues to cough, feel tired and have fatigue. Cough is still productive at times, exam is more reassuring today, CXR is negative. Will add Advair 45/12 1 puff po bid since Ventolin has been giving her some relief when she uses it. She has not tried the Tussionex yet since her husband has been out of town and she has been hesitant to take alone at home with the kids but she is sleeping better after a dose of Albuterol quiets the cough. She is encouraged to increase her po fluids and finish the course of Cefdinir, call if symptoms continue or worsen

## 2010-12-17 NOTE — Assessment & Plan Note (Signed)
Symptoms persistent despite just finishing a course of Steroids and continuing her antibiotics, she continues to cough, feel tired and have fatigue. Cough is still productive at times, exam is more reassuring today, CXR is negative. Will add Advair 45/12 1 puff po bid since Ventolin has been giving her some relief when she uses it. She has not tried the Tussionex yet since her husband has been out of town and she has been hesitant to take alone at home with the kids but she is sleeping better after a dose of Albuterol quiets the cough. She is encouraged to increase her po fluids and finish the course of Cefdinir, call if symptoms continue or worsen

## 2011-02-04 ENCOUNTER — Ambulatory Visit (INDEPENDENT_AMBULATORY_CARE_PROVIDER_SITE_OTHER): Payer: 59 | Admitting: Family Medicine

## 2011-02-04 ENCOUNTER — Telehealth: Payer: Self-pay | Admitting: Family Medicine

## 2011-02-04 ENCOUNTER — Encounter: Payer: Self-pay | Admitting: Family Medicine

## 2011-02-04 DIAGNOSIS — M940 Chondrocostal junction syndrome [Tietze]: Secondary | ICD-10-CM

## 2011-02-04 DIAGNOSIS — J209 Acute bronchitis, unspecified: Secondary | ICD-10-CM

## 2011-02-04 DIAGNOSIS — J40 Bronchitis, not specified as acute or chronic: Secondary | ICD-10-CM

## 2011-02-04 MED ORDER — METHYLPREDNISOLONE (PAK) 4 MG PO TABS: 4.0000 mg | ORAL_TABLET | Freq: Every day | ORAL | Status: AC

## 2011-02-04 MED ORDER — BENZONATATE 100 MG PO CAPS: 100.0000 mg | ORAL_CAPSULE | Freq: Three times a day (TID) | ORAL | Status: AC | PRN

## 2011-02-04 MED ORDER — LIDOCAINE 5 % EX PTCH: 1.0000 | MEDICATED_PATCH | CUTANEOUS | Status: AC

## 2011-02-04 NOTE — Telephone Encounter (Signed)
Patient feels like she has cracked a rib on her right side from coughing, she has made a 9AM appt for today. Patient is requesting to go for xray first. Can you put in an order or would you like to examine her first?

## 2011-02-04 NOTE — Assessment & Plan Note (Signed)
Has seen her allergist and they tried a course of Avelox which caused vomiting so she is back on Cefdinir. The fevers and green sputum and resolved she's having perfect the anterior chest wall pain with coughing and position changes. There is sharp in the specific area of suspect an element of costochondritis. She will finish the Ceftin ear and she is given a Medrol Dosepak. She is given an Ace wrap. Tessalon Perles 100 mg 3 times a day when necessary as well as a prescription for Lidoderm patches to help with pain. If cough and pain persists will call for x-ray possibility of Z-Pak. She is going to check her old records to see if she received tetanus with diphtheria or CPAP in the last 5 years. Consider boosting DDAVP as needed.

## 2011-02-04 NOTE — Telephone Encounter (Signed)
Per Dr. Abner Greenspan pt should come to appt first.  Pt notified and will come to appt first.

## 2011-02-04 NOTE — Patient Instructions (Signed)
Costochondritis Costochondritis (Tietze syndrome), or costochondral separation, is a swelling and irritation (inflammation) of the tissue (cartilage) that connects your ribs with your breastbone (sternum). It may occur on its own (spontaneously), through damage caused by an accident (trauma), or simply from coughing or minor exercise. It may take up to 6 weeks to get better and longer if you are unable to be conservative in your activities. HOME CARE INSTRUCTIONS   Avoid exhausting physical activity. Try not to strain your ribs during normal activity. This would include any activities using chest, belly (abdominal), and side muscles, especially if heavy weights are used.   Use ice for 15 to 20 minutes per hour while awake for the first 2 days. Place the ice in a plastic bag, and place a towel between the bag of ice and your skin.   Only take over-the-counter or prescription medicines for pain, discomfort, or fever as directed by your caregiver.  SEEK IMMEDIATE MEDICAL CARE IF:   Your pain increases or you are very uncomfortable.   You have a fever.   You develop difficulty with your breathing.   You cough up blood.   You develop worse chest pains, shortness of breath, sweating, or vomiting.   You develop new, unexplained problems (symptoms).  MAKE SURE YOU:   Understand these instructions.   Will watch your condition.   Will get help right away if you are not doing well or get worse.  Document Released: 11/13/2004 Document Revised: 10/16/2010 Document Reviewed: 09/22/2007 [employer] Patient Information 2012 [employer]. 

## 2011-02-04 NOTE — Progress Notes (Signed)
Patient ID: Kelli Barry, female   DOB: Aug 20, 1965, 45 y.o.   MRN: [id number] [facility]o [id number] December 23, 1965 02/04/2011      Progress Note-Follow Up  Subjective  Chief Complaint  Chief Complaint  Patient presents with  . Cough    HPI  Patient is a 45 year old Caucasian female who's been struggling with cough and bronchitis since October. She was treated with Ceftin and here it got somewhat better but unfortunately then worsened again. So her allergist and failed PFTs was placed on Avelox. Avelox causes nausea and vomiting so she stopped it. She spent 1 cm and is improving the cough is persistent and she now has severe right lower chest wall pain with coughing and position changes. He is very localized and if she does not move the pain subsides. She's been wrapping it with an Ace wrap which helps minimally. No fevers, chills, significant sputum production. No palpitations, shortness of breath or wheezing. No GI or GU complaints, malaise myalgias or fatigue are noted at this point.  Past Medical History  Diagnosis Date  . Acute bronchitis 12/15/2010    History reviewed. No pertinent past surgical history.  Family History  Problem Relation Age of Onset  . Diabetes Father     DM II  . Hypertension Father   . Melanoma Father   . Hypertension Mother   . Fibroids Mother     uterine  . Ovarian cysts Sister   . Cancer Maternal Grandmother     colon  . Coronary artery disease Maternal Grandmother   . Heart attack Maternal Grandmother   . Heart disease Maternal Grandfather   . Stroke Paternal Grandmother   . Dementia Paternal Grandmother   . Cancer Paternal Grandfather     Lung  . Emphysema Paternal Grandfather   . Allergies Son     History   Social History  . Marital Status: Married    Spouse Name: N/A    Number of Children: N/A  . Years of Education: N/A   Occupational History  . Not on file.   Social History Main Topics  . Smoking status: Never Smoker   .  Smokeless tobacco: Never Used  . Alcohol Use: 0.0 oz/week    0 drink(s) per week  . Drug Use: Not on file  . Sexually Active: Not on file   Other Topics Concern  . Not on file   Social History Narrative  . No narrative on file    Current Outpatient Prescriptions on File Prior to Visit  Medication Sig Dispense Refill  . albuterol (VENTOLIN HFA) 108 (90 BASE) MCG/ACT inhaler Inhale 2 puffs into the lungs every 6 (six) hours as needed for wheezing.  1 Inhaler  0  . estradiol (ESTRACE) 0.1 MG/GM vaginal cream Place 2 g vaginally daily.        . fluticasone-salmeterol (ADVAIR HFA) 45-21 MCG/ACT inhaler Inhale 1 puff into the lungs 2 (two) times daily.  1 Inhaler  0  . levocetirizine (XYZAL) 5 MG tablet Take 5 mg by mouth every evening.        . Norethin Ace-Eth Estrad-FE (LOESTRIN FE 1/20 PO) Take 1 tablet by mouth daily.        . simvastatin (ZOCOR) 20 MG tablet Take 20 mg by mouth at bedtime.          Allergies  Allergen Reactions  . Codeine   . Penicillins     Review of Systems  Review of Systems  Constitutional: Negative for fever and malaise/fatigue.  HENT: Positive for congestion. Negative for sore throat.   Eyes: Negative for discharge.  Respiratory: Positive for cough. Negative for shortness of breath and wheezing.   Cardiovascular: Positive for chest pain. Negative for palpitations and leg swelling.  Gastrointestinal: Negative for nausea, abdominal pain and diarrhea.  Genitourinary: Negative for dysuria.  Musculoskeletal: Negative for falls.  Skin: Negative for rash.  Neurological: Negative for loss of consciousness and headaches.  Endo/Heme/Allergies: Negative for polydipsia.  Psychiatric/Behavioral: Negative for depression and suicidal ideas. The patient is not nervous/anxious and does not have insomnia.     Objective  BP 114/78  Pulse 87  Temp(Src) 99.4 F (37.4 C) (Temporal)  Ht 5\' 1"  (1.549 m)  Wt 105 lb (47.628 kg)  BMI 19.84 kg/m2  SpO2 100%  LMP  01/14/2011  Physical Exam  Physical Exam  Constitutional: She is well-developed, well-nourished, and in no distress. No distress.  HENT:  Left Ear: External ear normal.  Mouth/Throat: No oropharyngeal exudate.  Eyes: EOM are normal. Left eye exhibits no discharge. No scleral icterus.  Neck: No JVD present. No tracheal deviation present.  Cardiovascular: Normal heart sounds and intact distal pulses.   Pulmonary/Chest: No respiratory distress. She has no rales. She exhibits tenderness.       Pain over midclavicular line anteriorly over lower right chest wall pain with palpation  Abdominal: She exhibits no distension and no mass. There is tenderness. There is no guarding.  Musculoskeletal: She exhibits no edema and no tenderness.  Lymphadenopathy:    She has no cervical adenopathy.  Skin: No rash noted. No erythema.       Assessment & Plan  Acute bronchitis Has seen her allergist and they tried a course of Avelox which caused vomiting so she is back on Cefdinir. The fevers and green sputum and resolved she's having perfect the anterior chest wall pain with coughing and position changes. There is sharp in the specific area of suspect an element of costochondritis. She will finish the Ceftin ear and she is given a Medrol Dosepak. She is given an Ace wrap. Tessalon Perles 100 mg 3 times a day when necessary as well as a prescription for Lidoderm patches to help with pain. If cough and pain persists will call for x-ray possibility of Z-Pak. She is going to check her old records to see if she received tetanus with diphtheria or CPAP in the last 5 years. Consider boosting DDAVP as needed.

## 2011-03-07 ENCOUNTER — Ambulatory Visit: Payer: 59 | Admitting: Licensed Clinical Social Worker

## 2011-03-28 ENCOUNTER — Ambulatory Visit (INDEPENDENT_AMBULATORY_CARE_PROVIDER_SITE_OTHER): Payer: 59 | Admitting: Licensed Clinical Social Worker

## 2011-03-28 DIAGNOSIS — F4323 Adjustment disorder with mixed anxiety and depressed mood: Secondary | ICD-10-CM

## 2011-04-11 ENCOUNTER — Ambulatory Visit: Payer: 59 | Admitting: Licensed Clinical Social Worker

## 2012-04-19 ENCOUNTER — Other Ambulatory Visit: Payer: Self-pay | Admitting: Endocrinology

## 2012-04-19 DIAGNOSIS — E010 Iodine-deficiency related diffuse (endemic) goiter: Secondary | ICD-10-CM

## 2012-04-30 ENCOUNTER — Other Ambulatory Visit: Payer: 59

## 2012-04-30 ENCOUNTER — Ambulatory Visit
Admission: RE | Admit: 2012-04-30 | Discharge: 2012-04-30 | Disposition: A | Discharge status: Home / Self Care | Payer: 59 | Source: Ambulatory Visit | Attending: Endocrinology | Admitting: Endocrinology

## 2012-04-30 DIAGNOSIS — E010 Iodine-deficiency related diffuse (endemic) goiter: Secondary | ICD-10-CM

## 2012-05-04 ENCOUNTER — Other Ambulatory Visit: Payer: Self-pay | Admitting: Endocrinology

## 2012-05-04 DIAGNOSIS — E041 Nontoxic single thyroid nodule: Secondary | ICD-10-CM

## 2012-05-27 ENCOUNTER — Inpatient Hospital Stay
Admission: RE | Admit: 2012-05-27 | Discharge: 2012-05-27 | Disposition: A | Discharge status: Home / Self Care | Payer: 59 | Source: Ambulatory Visit | Attending: Endocrinology | Admitting: Endocrinology

## 2012-06-16 ENCOUNTER — Other Ambulatory Visit (HOSPITAL_COMMUNITY)
Admission: RE | Admit: 2012-06-16 | Discharge: 2012-06-16 | Disposition: A | Discharge status: Home / Self Care | Payer: 59 | Source: Ambulatory Visit | Attending: Interventional Radiology | Admitting: Interventional Radiology

## 2012-06-16 ENCOUNTER — Ambulatory Visit
Admission: RE | Admit: 2012-06-16 | Discharge: 2012-06-16 | Disposition: A | Discharge status: Home / Self Care | Payer: 59 | Source: Ambulatory Visit | Attending: Endocrinology | Admitting: Endocrinology

## 2012-06-16 DIAGNOSIS — E041 Nontoxic single thyroid nodule: Secondary | ICD-10-CM

## 2012-10-08 ENCOUNTER — Other Ambulatory Visit: Payer: Self-pay | Admitting: Family Medicine

## 2012-10-21 ENCOUNTER — Other Ambulatory Visit: Payer: Self-pay | Admitting: Endocrinology

## 2012-10-21 DIAGNOSIS — E049 Nontoxic goiter, unspecified: Secondary | ICD-10-CM

## 2012-12-07 ENCOUNTER — Other Ambulatory Visit: Payer: Self-pay

## 2012-12-07 DIAGNOSIS — Z1231 Encounter for screening mammogram for malignant neoplasm of breast: Secondary | ICD-10-CM

## 2012-12-24 ENCOUNTER — Ambulatory Visit
Admission: RE | Admit: 2012-12-24 | Discharge: 2012-12-24 | Disposition: A | Discharge status: Home / Self Care | Payer: 59 | Source: Ambulatory Visit

## 2012-12-24 DIAGNOSIS — Z1231 Encounter for screening mammogram for malignant neoplasm of breast: Secondary | ICD-10-CM

## 2013-04-20 ENCOUNTER — Other Ambulatory Visit: Payer: 59

## 2013-04-25 ENCOUNTER — Other Ambulatory Visit: Payer: 59

## 2013-04-29 ENCOUNTER — Ambulatory Visit
Admission: RE | Admit: 2013-04-29 | Discharge: 2013-04-29 | Disposition: A | Discharge status: Home / Self Care | Payer: 59 | Source: Ambulatory Visit | Attending: Endocrinology | Admitting: Endocrinology

## 2013-04-29 DIAGNOSIS — E049 Nontoxic goiter, unspecified: Secondary | ICD-10-CM

## 2013-05-03 ENCOUNTER — Other Ambulatory Visit: Payer: Self-pay | Admitting: Endocrinology

## 2013-05-03 DIAGNOSIS — E049 Nontoxic goiter, unspecified: Secondary | ICD-10-CM

## 2013-10-27 ENCOUNTER — Ambulatory Visit
Admission: RE | Admit: 2013-10-27 | Discharge: 2013-10-27 | Disposition: A | Discharge status: Home / Self Care | Payer: 59 | Source: Ambulatory Visit | Attending: Endocrinology | Admitting: Endocrinology

## 2013-10-27 DIAGNOSIS — E049 Nontoxic goiter, unspecified: Secondary | ICD-10-CM

## 2013-11-02 ENCOUNTER — Other Ambulatory Visit: Payer: 59

## 2013-11-11 ENCOUNTER — Other Ambulatory Visit: Payer: Self-pay | Admitting: Obstetrics & Gynecology

## 2013-11-11 DIAGNOSIS — M8580 Other specified disorders of bone density and structure, unspecified site: Secondary | ICD-10-CM

## 2013-11-22 ENCOUNTER — Other Ambulatory Visit: Payer: 59

## 2013-12-26 ENCOUNTER — Other Ambulatory Visit: Payer: Self-pay | Admitting: Obstetrics & Gynecology

## 2013-12-26 DIAGNOSIS — M8580 Other specified disorders of bone density and structure, unspecified site: Secondary | ICD-10-CM

## 2013-12-30 ENCOUNTER — Encounter: Payer: Self-pay | Admitting: Internal Medicine

## 2014-01-18 ENCOUNTER — Ambulatory Visit (INDEPENDENT_AMBULATORY_CARE_PROVIDER_SITE_OTHER): Payer: 59

## 2014-01-18 DIAGNOSIS — M8580 Other specified disorders of bone density and structure, unspecified site: Secondary | ICD-10-CM

## 2014-01-18 DIAGNOSIS — M810 Age-related osteoporosis without current pathological fracture: Secondary | ICD-10-CM

## 2014-02-15 ENCOUNTER — Ambulatory Visit (INDEPENDENT_AMBULATORY_CARE_PROVIDER_SITE_OTHER): Payer: 59 | Admitting: Physician Assistant

## 2014-02-15 ENCOUNTER — Encounter: Payer: Self-pay | Admitting: Physician Assistant

## 2014-02-15 ENCOUNTER — Ambulatory Visit (AMBULATORY_SURGERY_CENTER): Payer: Self-pay | Admitting: *Deleted

## 2014-02-15 VITALS — Ht 62.0 in | Wt 103.0 lb

## 2014-02-15 VITALS — BP 94/64 | HR 103 | Temp 98.0°F | Resp 14 | Ht 61.0 in | Wt 102.4 lb

## 2014-02-15 DIAGNOSIS — Z1211 Encounter for screening for malignant neoplasm of colon: Secondary | ICD-10-CM

## 2014-02-15 DIAGNOSIS — J209 Acute bronchitis, unspecified: Secondary | ICD-10-CM

## 2014-02-15 MED ORDER — MOVIPREP 100 G PO SOLR: 1.0000 | Freq: Once | ORAL | Status: DC

## 2014-02-15 MED ORDER — AZITHROMYCIN 250 MG PO TABS: ORAL_TABLET | ORAL | Status: DC

## 2014-02-15 MED ORDER — BENZONATATE 100 MG PO CAPS: 100.0000 mg | ORAL_CAPSULE | Freq: Two times a day (BID) | ORAL | Status: DC | PRN

## 2014-02-15 NOTE — Progress Notes (Signed)
Pre visit review using our clinic review tool, if applicable. No additional management support is needed unless otherwise documented below in the visit note/SLS  

## 2014-02-15 NOTE — Assessment & Plan Note (Signed)
Rx Azithromycin. Increase fluids.  Rest.  Mucinex DM.  Rx Tessalon Perles to take as directed.  Return precautions discussed with patient.

## 2014-02-15 NOTE — Patient Instructions (Signed)
Take antibiotic as directed.  Increase fluids.  Get plenty of rest.  Use Mucinex as directed.  Use Tessalon Perles for cough.  Call or return to clinic if symptoms are not improving.  Acute Bronchitis Bronchitis is inflammation of the airways that extend from the windpipe into the lungs (bronchi). The inflammation often causes mucus to develop. This leads to a cough, which is the most common symptom of bronchitis.  In acute bronchitis, the condition usually develops suddenly and goes away over time, usually in a couple weeks. Smoking, allergies, and asthma can make bronchitis worse. Repeated episodes of bronchitis may cause further lung problems.  CAUSES Acute bronchitis is most often caused by the same virus that causes a cold. The virus can spread from person to person (contagious) through coughing, sneezing, and touching contaminated objects. SIGNS AND SYMPTOMS   Cough.   Fever.   Coughing up mucus.   Body aches.   Chest congestion.   Chills.   Shortness of breath.   Sore throat.  DIAGNOSIS  Acute bronchitis is usually diagnosed through a physical exam. Your health care provider will also ask you questions about your medical history. Tests, such as chest X-rays, are sometimes done to rule out other conditions.  TREATMENT  Acute bronchitis usually goes away in a couple weeks. Oftentimes, no medical treatment is necessary. Medicines are sometimes given for relief of fever or cough. Antibiotic medicines are usually not needed but may be prescribed in certain situations. In some cases, an inhaler may be recommended to help reduce shortness of breath and control the cough. A cool mist vaporizer may also be used to help thin bronchial secretions and make it easier to clear the chest.  HOME CARE INSTRUCTIONS  Get plenty of rest.   Drink enough fluids to keep your urine clear or pale yellow (unless you have a medical condition that requires fluid restriction). Increasing fluids  may help thin your respiratory secretions (sputum) and reduce chest congestion, and it will prevent dehydration.   Take medicines only as directed by your health care provider.  If you were prescribed an antibiotic medicine, finish it all even if you start to feel better.  Avoid smoking and secondhand smoke. Exposure to cigarette smoke or irritating chemicals will make bronchitis worse. If you are a smoker, consider using nicotine gum or skin patches to help control withdrawal symptoms. Quitting smoking will help your lungs heal faster.   Reduce the chances of another bout of acute bronchitis by washing your hands frequently, avoiding people with cold symptoms, and trying not to touch your hands to your mouth, nose, or eyes.   Keep all follow-up visits as directed by your health care provider.  SEEK MEDICAL CARE IF: Your symptoms do not improve after 1 week of treatment.  SEEK IMMEDIATE MEDICAL CARE IF:  You develop an increased fever or chills.   You have chest pain.   You have severe shortness of breath.  You have bloody sputum.   You develop dehydration.  You faint or repeatedly feel like you are going to pass out.  You develop repeated vomiting.  You develop a severe headache. MAKE SURE YOU:   Understand these instructions.  Will watch your condition.  Will get help right away if you are not doing well or get worse. Document Released: 03/13/2004 Document Revised: 06/20/2013 Document Reviewed: 07/27/2012 [facility] LLC[facility] Patient Information 2015 [employer], [place]. This information is not intended to replace advice given to you by your health care provider. Make sure  you discuss any questions you have with your health care provider.

## 2014-02-15 NOTE — Progress Notes (Signed)
No egg or soy allergy. No anesthesia problems.  No home O2.  No diet meds.  

## 2014-02-15 NOTE — Progress Notes (Signed)
Past Medical History  Diagnosis Date  . Acute bronchitis 12/15/2010  . Allergy     seasonal  . Diabetes mellitus without complication     diet controlled  . Hyperlipidemia   . Osteoporosis     Current Outpatient Prescriptions on File Prior to Visit  Medication Sig Dispense Refill  . albuterol (VENTOLIN HFA) 108 (90 BASE) MCG/ACT inhaler Inhale 2 puffs into the lungs every 6 (six) hours as needed for wheezing. 1 Inhaler 0  . estradiol (ESTRACE) 0.1 MG/GM vaginal cream Place 2 g vaginally daily.      Marland Kitchen MOVIPREP 100 G SOLR Take 1 kit (200 g total) by mouth once. Name brand only, movi prep as directed, no substitutions. 1 kit 0   No current facility-administered medications on file prior to visit.    Allergies  Allergen Reactions  . Codeine   . Penicillins     Family History  Problem Relation Age of Onset  . Diabetes Father     DM II  . Hypertension Father   . Melanoma Father   . Hypertension Mother   . Fibroids Mother     uterine  . Ovarian cysts Sister   . Cancer Maternal Grandmother     colon  . Coronary artery disease Maternal Grandmother   . Heart attack Maternal Grandmother   . Colon cancer Maternal Grandmother 40  . Heart disease Maternal Grandfather   . Stroke Paternal Grandmother   . Dementia Paternal Grandmother   . Cancer Paternal Grandfather     Lung  . Emphysema Paternal Grandfather   . Allergies Son     History   Social History  . Marital Status: Married    Spouse Name: N/A    Number of Children: N/A  . Years of Education: N/A   Social History Main Topics  . Smoking status: Never Smoker   . Smokeless tobacco: Never Used  . Alcohol Use: 0.0 oz/week    0 Not specified per week  . Drug Use: No  . Sexual Activity: None   Other Topics Concern  . None   Social History Narrative    Review of Systems - See HPI.  All other ROS are negative.  Ht 5' 1"  (1.549 m)  LMP 01/14/2011  Physical Exam  No results found for this or any  previous visit (from the past 2160 hour(s)).  Assessment/Plan: No problem-specific assessment & plan notes found for this encounter.     Subjective:     Kelli Barry is a 48 y.o. female who presents for evaluation of symptoms of a URI. Symptoms include achiness, nasal congestion, no  fever, post nasal drip, productive cough with  yellow colored sputum, sinus pressure and sore throat. Onset of symptoms was 1 week ago, and has been gradually worsening since that time. Treatment to date: cough suppressants.  The following portions of the patient's history were reviewed and updated as appropriate: allergies, current medications, past family history, past medical history, past social history, past surgical history and problem list.  Review of Systems Pertinent items are noted in HPI.   Objective:    BP 94/64 mmHg  Pulse 103  Temp(Src) 98 F (36.7 C) (Oral)  Resp 14  Ht 5' 1"  (1.549 m)  Wt 102 lb 6 oz (46.437 kg)  BMI 19.35 kg/m2  SpO2 99%  LMP 01/14/2011 General appearance: alert, cooperative, appears stated age and no distress Head: Normocephalic, without obvious abnormality, atraumatic Eyes: conjunctivae/corneas clear. PERRL, EOM's intact.  Fundi benign. Ears: normal TM's and external ear canals both ears Nose: Nares normal. Septum midline. Mucosa normal. No drainage or sinus tenderness. Throat: lips, mucosa, and tongue normal; teeth and gums normal Neck: no adenopathy, no carotid bruit, no JVD, supple, symmetrical, trachea midline and thyroid not enlarged, symmetric, no tenderness/mass/nodules Back: symmetric, no curvature. ROM normal. No CVA tenderness. Lungs: clear to auscultation bilaterally Heart: regular rate and rhythm, S1, S2 normal, no murmur, click, rub or gallop Pulses: 2+ and symmetric Lymph nodes: Cervical, supraclavicular, and axillary nodes normal.   Assessment:    bronchitis   Plan:    Suggested symptomatic OTC remedies. Nasal saline spray for  congestion. Zithromax per orders. Follow up as needed. Tessalon Perles per orders.

## 2014-02-20 ENCOUNTER — Telehealth: Payer: Self-pay | Admitting: Family Medicine

## 2014-02-20 DIAGNOSIS — J209 Acute bronchitis, unspecified: Secondary | ICD-10-CM

## 2014-02-20 MED ORDER — BENZONATATE 100 MG PO CAPS: 100.0000 mg | ORAL_CAPSULE | Freq: Two times a day (BID) | ORAL | Status: DC | PRN

## 2014-02-20 MED ORDER — DOXYCYCLINE HYCLATE 100 MG PO CAPS: 100.0000 mg | ORAL_CAPSULE | Freq: Two times a day (BID) | ORAL | Status: DC

## 2014-02-20 NOTE — Telephone Encounter (Signed)
Rx Doxycycline sent to pharmacy.  Take as directed.  Also refill of Tessalon Perles sent in.  If no improvement, she will need re-evaluation.

## 2014-02-20 NOTE — Telephone Encounter (Signed)
Caller name:Streat, Aleja Relation to ZO:XWRU Call back [phone] Pharmacy: [facility]  Reason for call: pt saw cody on 02/14/14 states she has taken all of her meds and she still has congestion, cough, and mucus, would like to know if Selena Batten can call her something else in.

## 2014-02-20 NOTE — Telephone Encounter (Signed)
Patient was seen on 12.30.15 and given Zpack & Benzonatate (#20 BID); c/o continued congestion w/yellow mucus and cough/SLS Please Advise.

## 2014-02-20 NOTE — Telephone Encounter (Signed)
Patient informed, understood & agreed/SLS  

## 2014-03-03 ENCOUNTER — Encounter: Payer: Self-pay | Admitting: Internal Medicine

## 2014-03-03 ENCOUNTER — Ambulatory Visit (AMBULATORY_SURGERY_CENTER): Payer: 59 | Admitting: Internal Medicine

## 2014-03-03 VITALS — BP 96/65 | HR 60 | Temp 97.4°F | Resp 18 | Ht 62.0 in | Wt 103.0 lb

## 2014-03-03 DIAGNOSIS — Z1211 Encounter for screening for malignant neoplasm of colon: Secondary | ICD-10-CM

## 2014-03-03 MED ORDER — SODIUM CHLORIDE 0.9 % IV SOLN
500.0000 mL | INTRAVENOUS | Status: DC
Start: 2014-03-03 — End: 2014-03-03

## 2014-03-03 NOTE — Op Note (Addendum)
[facility] [address] [place], [id number]   COLONOSCOPY PROCEDURE REPORT  PATIENT: [name], Kelli  MR#: [id number] BIRTHDATE: 1965-09-11 , 48  yrs. old GENDER: female ENDOSCOPIST: Hart Carwinora M Baillie Mohammad, MD REFERRED XB:JYNWGBY:Stacy Abner GreenspanBlyth, M.D. , Dr Lincoln MaxinML Lavoie PROCEDURE DATE:  03/03/2014 PROCEDURE:   Colonoscopy, screening First Screening Colonoscopy - Avg.  risk and is 50 yrs.  old or older Yes.  Prior Negative Screening - Now for repeat screening. N/A  History of Adenoma - Now for follow-up colonoscopy & has been > or = to 3 yrs.  N/A  Polyps Removed Today? No. ASA CLASS:   Class I INDICATIONS:average risk for colon cancer. , idirect relative ( GP ) with colon cancer MEDICATIONS: Monitored anesthesia care and Propofol 450 mg IV  DESCRIPTION OF PROCEDURE:   After the risks benefits and alternatives of the procedure were thoroughly explained, informed consent was obtained.  The digital rectal exam revealed no abnormalities of the rectum.   The LB PFC-H190 [id number]  endoscope was introduced through the anus and advanced to the cecum, which was identified by both the appendix and ileocecal valve. No adverse events experienced.   The quality of the prep was good, using MoviPrep  The instrument was then slowly withdrawn as the colon was fully examined.      COLON FINDINGS: A normal appearing cecum, ileocecal valve, and appendiceal orifice were identified.  The ascending, transverse, descending, sigmoid colon, and rectum appeared unremarkable. Retroflexed views revealed no abnormalities. The time to cecum=9 minutes 050 seconds.  Withdrawal time=6.  minutes 00 seconds.  The scope was withdrawn and the procedure completed. COMPLICATIONS: There were no immediate complications.  ENDOSCOPIC IMPRESSION: Normal colonoscopy  RECOMMENDATIONS: High fiber diet Recall colonoscopy in 10 years  eSigned:  Hart Carwinora M Tiajah Oyster, MD 03/03/2014 8:59 AM Revised: 03/03/2014 8:59  AM  cc:

## 2014-03-03 NOTE — Patient Instructions (Signed)
YOU HAD AN ENDOSCOPIC PROCEDURE TODAY AT THE [facility]: Refer to the procedure report that was given to you for any specific questions about what was found during the examination.  If the procedure report does not answer your questions, please call your gastroenterologist to clarify.  If you requested that your care partner not be given the details of your procedure findings, then the procedure report has been included in a sealed envelope for you to review at your convenience later.  YOU SHOULD EXPECT: Some feelings of bloating in the abdomen. Passage of more gas than usual.  Walking can help get rid of the air that was put into your GI tract during the procedure and reduce the bloating. If you had a lower endoscopy (such as a colonoscopy or flexible sigmoidoscopy) you may notice spotting of blood in your stool or on the toilet paper. If you underwent a bowel prep for your procedure, then you may not have a normal bowel movement for a few days.  DIET: Your first meal following the procedure should be a light meal and then it is ok to progress to your normal diet.  A half-sandwich or bowl of soup is an example of a good first meal.  Heavy or fried foods are harder to digest and may make you feel nauseous or bloated.  Likewise meals heavy in dairy and vegetables can cause extra gas to form and this can also increase the bloating.  Drink plenty of fluids but you should avoid alcoholic beverages for 24 hours.  ACTIVITY: Your care partner should take you home directly after the procedure.  You should plan to take it easy, moving slowly for the rest of the day.  You can resume normal activity the day after the procedure however you should NOT DRIVE or use heavy machinery for 24 hours (because of the sedation medicines used during the test).    SYMPTOMS TO REPORT IMMEDIATELY: A gastroenterologist can be reached at any hour.  During normal business hours, 8:30 AM to 5:00 PM Monday through Friday,  call [phone].  After hours and on weekends, please call the GI answering service at [phone] who will take a message and have the physician on call contact you.   Following lower endoscopy (colonoscopy or flexible sigmoidoscopy):  Excessive amounts of blood in the stool  Significant tenderness or worsening of abdominal pains  Swelling of the abdomen that is new, acute  Fever of 100F or higher  FOLLOW UP: If any biopsies were taken you will be contacted by phone or by letter within the next 1-3 weeks.  Call your gastroenterologist if you have not heard about the biopsies in 3 weeks.  Our staff will call the home number listed on your records the next business day following your procedure to check on you and address any questions or concerns that you may have at that time regarding the information given to you following your procedure. This is a courtesy call and so if there is no answer at the home number and we have not heard from you through the emergency physician on call, we will assume that you have returned to your regular daily activities without incident.  SIGNATURES/CONFIDENTIALITY: You and/or your care partner have signed paperwork which will be entered into your electronic medical record.  These signatures attest to the fact that that the information above on your After Visit Summary has been reviewed and is understood.  Full responsibility of the confidentiality of this   discharge information lies with you and/or your care-partner.  Recommendations Next colonoscopy in 10 years. High fiber diet handout provided to patient/care partner. 

## 2014-03-03 NOTE — Progress Notes (Signed)
A/ox3, pleased with MAC, report to RN 

## 2014-03-06 ENCOUNTER — Telehealth: Payer: Self-pay

## 2014-03-06 NOTE — Telephone Encounter (Signed)
  Follow up Call-  Call back number 03/03/2014  Post procedure Call Back phone  # [phone]  Permission to leave phone message Yes     [facility]

## 2014-03-29 ENCOUNTER — Other Ambulatory Visit: Payer: Self-pay | Admitting: Dermatology

## 2014-05-02 LAB — HEMOGLOBIN A1C: Hgb A1c MFr Bld: 5.5 % (ref 4.0–6.0)

## 2014-05-16 ENCOUNTER — Ambulatory Visit: Payer: 59 | Admitting: Physician Assistant

## 2014-05-17 ENCOUNTER — Encounter: Payer: Self-pay | Admitting: Physician Assistant

## 2014-05-17 ENCOUNTER — Telehealth: Payer: Self-pay | Admitting: Family Medicine

## 2014-05-17 NOTE — Telephone Encounter (Signed)
PT was no show for acute visit on 05/16/14- letter sent- charge?

## 2014-05-17 NOTE — Telephone Encounter (Signed)
No charge. 

## 2014-07-25 ENCOUNTER — Ambulatory Visit (INDEPENDENT_AMBULATORY_CARE_PROVIDER_SITE_OTHER): Payer: 59 | Admitting: Family Medicine

## 2014-07-25 ENCOUNTER — Encounter: Payer: Self-pay | Admitting: Family Medicine

## 2014-07-25 VITALS — BP 97/70 | HR 77 | Temp 98.2°F | Ht 62.0 in | Wt 104.2 lb

## 2014-07-25 DIAGNOSIS — E559 Vitamin D deficiency, unspecified: Secondary | ICD-10-CM

## 2014-07-25 DIAGNOSIS — E119 Type 2 diabetes mellitus without complications: Secondary | ICD-10-CM | POA: Diagnosis not present

## 2014-07-25 DIAGNOSIS — Z0000 Encounter for general adult medical examination without abnormal findings: Secondary | ICD-10-CM

## 2014-07-25 DIAGNOSIS — M4120 Other idiopathic scoliosis, site unspecified: Secondary | ICD-10-CM

## 2014-07-25 DIAGNOSIS — E118 Type 2 diabetes mellitus with unspecified complications: Secondary | ICD-10-CM

## 2014-07-25 DIAGNOSIS — E039 Hypothyroidism, unspecified: Secondary | ICD-10-CM

## 2014-07-25 DIAGNOSIS — R109 Unspecified abdominal pain: Secondary | ICD-10-CM

## 2014-07-25 DIAGNOSIS — R1084 Generalized abdominal pain: Secondary | ICD-10-CM | POA: Diagnosis not present

## 2014-07-25 DIAGNOSIS — E782 Mixed hyperlipidemia: Secondary | ICD-10-CM

## 2014-07-25 LAB — CBC
HCT: 39.4 % (ref 36.0–46.0)
Hemoglobin: 13.5 g/dL (ref 12.0–15.0)
MCHC: 34.1 g/dL (ref 30.0–36.0)
MCV: 88 fl (ref 78.0–100.0)
Platelets: 245 10*3/uL (ref 150.0–400.0)
RBC: 4.48 Mil/uL (ref 3.87–5.11)
RDW: 13.4 % (ref 11.5–15.5)
WBC: 5.8 10*3/uL (ref 4.0–10.5)

## 2014-07-25 LAB — COMPREHENSIVE METABOLIC PANEL
ALT: 12 U/L (ref 0–35)
AST: 18 U/L (ref 0–37)
Albumin: 4.5 g/dL (ref 3.5–5.2)
Alkaline Phosphatase: 56 U/L (ref 39–117)
BUN: 15 mg/dL (ref 6–23)
CO2: 29 mEq/L (ref 19–32)
Calcium: 9.3 mg/dL (ref 8.4–10.5)
Chloride: 101 mEq/L (ref 96–112)
Creatinine, Ser: 0.75 mg/dL (ref 0.40–1.20)
GFR: 86.46 mL/min (ref >60.00)
Glucose, Bld: 87 mg/dL (ref 70–99)
Potassium: 3.9 mEq/L (ref 3.5–5.1)
Sodium: 136 mEq/L (ref 135–145)
Total Bilirubin: 0.5 mg/dL (ref 0.2–1.2)
Total Protein: 7.1 g/dL (ref 6.0–8.3)

## 2014-07-25 NOTE — Progress Notes (Signed)
Kelli CarrowDeanna Barry  [id number] 1965-06-10 07/25/2014      Progress Note-Follow Up  Subjective  Chief Complaint  Chief Complaint  Patient presents with  . Establish Care    HPI  Patient is a 49 y.o. female in today for routine medical care. Patient is in today to reestablish care. Feeling well today. Does note with her new job this past year scoliosis and back pain has bothered her more but does not take medications regularly. Also c/o intermittent abdominal discomfort, none at today's visit. No pattern as t when it occurs and resolves spontaneously.Continues to follow with OB/GYN for Pap smears and mammograms and reports no concerns. Follows with endocrinology, Dr. Talmage NapBalan for her thyroid and diabetes. Last colonoscopy was 2010 with Dr. Juanda ChanceBrodie and was within normal limits no recent illness. Denies CP/palp/SOB/HA/congestion/feversor GU c/o. Taking meds as prescribed  Past Medical History  Diagnosis Date  . Acute bronchitis 12/15/2010  . Allergy     seasonal  . Diabetes mellitus without complication     diet controlled  . Hyperlipidemia   . Osteoporosis     Past Surgical History  Procedure Laterality Date  . No past surgeries      Family History  Problem Relation Age of Onset  . Diabetes Father     DM II  . Hypertension Father   . Melanoma Father   . Hypertension Mother   . Fibroids Mother     uterine  . Ovarian cysts Sister   . Cancer Maternal Grandmother     colon  . Coronary artery disease Maternal Grandmother   . Heart attack Maternal Grandmother   . Colon cancer Maternal Grandmother 75  . Heart disease Maternal Grandfather   . Stroke Paternal Grandmother   . Dementia Paternal Grandmother   . Cancer Paternal Grandfather     Lung  . Emphysema Paternal Grandfather   . Allergies Son     History   Social History  . Marital Status: Married    Spouse Name: N/A  . Number of Children: N/A  . Years of Education: N/A   Occupational History  . Not on file.    Social History Main Topics  . Smoking status: Never Smoker   . Smokeless tobacco: Never Used  . Alcohol Use: 0.0 oz/week    0 Standard drinks or equivalent per week  . Drug Use: No  . Sexual Activity: Not on file   Other Topics Concern  . Not on file   Social History Narrative    Current Outpatient Prescriptions on File Prior to Visit  Medication Sig Dispense Refill  . estradiol (VIVELLE-DOT) 0.05 MG/24HR patch Place 1 patch onto the skin 2 (two) times a week.    . levocetirizine (XYZAL) 5 MG tablet Take 5 mg by mouth every evening.    . Progesterone Micronized (PROGESTERONE PO) Take by mouth.     No current facility-administered medications on file prior to visit.    Allergies  Allergen Reactions  . Codeine   . Penicillins     Review of Systems  Review of Systems  Constitutional: Negative for fever, chills and malaise/fatigue.  HENT: Negative for congestion, hearing loss and nosebleeds.   Eyes: Negative for discharge.  Respiratory: Negative for cough, sputum production, shortness of breath and wheezing.   Cardiovascular: Negative for chest pain, palpitations and leg swelling.  Gastrointestinal: Positive for abdominal pain. Negative for heartburn, nausea, vomiting, diarrhea, constipation and blood in stool.  Genitourinary: Negative for dysuria, urgency, frequency and hematuria.  Musculoskeletal: Positive for back pain. Negative for myalgias and falls.  Skin: Negative for rash.  Neurological: Negative for dizziness, tremors, sensory change, focal weakness, loss of consciousness, weakness and headaches.  Endo/Heme/Allergies: Negative for polydipsia. Does not bruise/bleed easily.  Psychiatric/Behavioral: Negative for depression and suicidal ideas. The patient is not nervous/anxious and does not have insomnia.     Objective  BP 97/70 mmHg  Pulse 77  Temp(Src) 98.2 F (36.8 C) (Oral)  Ht  (1.575 m)  Wt 104 lb 4 oz (47.287 kg)  BMI 19.06 kg/m2  SpO2 100%   LMP 01/14/2011  Physical Exam  Physical Exam  Constitutional: She is oriented to person, place, and time and well-developed, well-nourished, and in no distress. No distress.  HENT:  Head: Normocephalic and atraumatic.  Right Ear: External ear normal.  Left Ear: External ear normal.  Nose: Nose normal.  Mouth/Throat: Oropharynx is clear and moist. No oropharyngeal exudate.  Eyes: Conjunctivae are normal. Pupils are equal, round, and reactive to light. Right eye exhibits no discharge. Left eye exhibits no discharge. No scleral icterus.  Neck: Normal range of motion. Neck supple. No thyromegaly present.  Cardiovascular: Normal rate, regular rhythm, normal heart sounds and intact distal pulses.   No murmur heard. Pulmonary/Chest: Effort normal and breath sounds normal. No respiratory distress. She has no wheezes. She has no rales.  Abdominal: Soft. Bowel sounds are normal. She exhibits no distension and no mass. There is no tenderness.  Musculoskeletal: Normal range of motion. She exhibits no edema or tenderness.  Lymphadenopathy:    She has no cervical adenopathy.  Neurological: She is alert and oriented to person, place, and time. She has normal reflexes. No cranial nerve deficit. Coordination normal.  Skin: Skin is warm and dry. No rash noted. She is not diaphoretic.  Psychiatric: Mood, memory and affect normal.     Assessment & Plan  MIXED HYPERLIPIDEMIA Encouraged heart healthy diet, increase exercise, avoid trans fats, consider a krill oil cap daily  Vitamin D deficiency Encouraged 2000 IU qd  OSTEOPENIA Encouraged vit d and calcium supplements and regular exercise  Hypothyroidism On Levothyroxine, continue to monitor  Diabetes type 2, controlled Follows with endocrinology reports A1C every 4 months, diet controlled. Will request records EKG today wnl  SCOLIOSIS Back pain worse with new job. Encouraged moist heat and gentle stretching as tolerated. May try NSAIDs and  prescription meds as directed and report if symptoms worsen or seek immediate care  Preventative health care Patient encouraged to maintain heart healthy diet, regular exercise, adequate sleep. Consider daily probiotics. Take medications as prescribed. Labs ordered and reviewed.  Follows with endocrinology Dr Talmage Nap Gastroenterology, Dr Juanda Chance OB/GYN for paps, Dexa Scans and [facility] Given and reviewed copy of ACP documents from [facility] Secretary of State and encouraged to complete and return  Generalized abdominal pain Intermittent, vague symptoms. Encouraged clean diet, adequate hydration, add a probiotic and fiber supplement and report if symptoms worsen

## 2014-07-25 NOTE — Patient Instructions (Signed)
Preventive Care for Adults A healthy lifestyle and preventive care can promote health and wellness. Preventive health guidelines for women include the following key practices.  A routine yearly physical is a good way to check with your health care provider about your health and preventive screening. It is a chance to share any concerns and updates on your health and to receive a thorough exam.  Visit your dentist for a routine exam and preventive care every 6 months. Brush your teeth twice a day and floss once a day. Good oral hygiene prevents tooth decay and gum disease.  The frequency of eye exams is based on your age, health, family medical history, use of contact lenses, and other factors. Follow your health care provider's recommendations for frequency of eye exams.  Eat a healthy diet. Foods like vegetables, fruits, whole grains, low-fat dairy products, and lean protein foods contain the nutrients you need without too many calories. Decrease your intake of foods high in solid fats, added sugars, and salt. Eat the right amount of calories for you.Get information about a proper diet from your health care provider, if necessary.  Regular physical exercise is one of the most important things you can do for your health. Most adults should get at least 150 minutes of moderate-intensity exercise (any activity that increases your heart rate and causes you to sweat) each week. In addition, most adults need muscle-strengthening exercises on 2 or more days a week.  Maintain a healthy weight. The body mass index (BMI) is a screening tool to identify possible weight problems. It provides an estimate of body fat based on height and weight. Your health care provider can find your BMI and can help you achieve or maintain a healthy weight.For adults 20 years and older:  A BMI below 18.5 is considered underweight.  A BMI of 18.5 to 24.9 is normal.  A BMI of 25 to 29.9 is considered overweight.  A BMI of  30 and above is considered obese.  Maintain normal blood lipids and cholesterol levels by exercising and minimizing your intake of saturated fat. Eat a balanced diet with plenty of fruit and vegetables. Blood tests for lipids and cholesterol should begin at age 76 and be repeated every 5 years. If your lipid or cholesterol levels are high, you are over 50, or you are at high risk for heart disease, you may need your cholesterol levels checked more frequently.Ongoing high lipid and cholesterol levels should be treated with medicines if diet and exercise are not working.  If you smoke, find out from your health care provider how to quit. If you do not use tobacco, do not start.  Lung cancer screening is recommended for adults aged 22-80 years who are at high risk for developing lung cancer because of a history of smoking. A yearly low-dose CT scan of the lungs is recommended for people who have at least a 30-pack-year history of smoking and are a current smoker or have quit within the past 15 years. A pack year of smoking is smoking an average of 1 pack of cigarettes a day for 1 year (for example: 1 pack a day for 30 years or 2 packs a day for 15 years). Yearly screening should continue until the smoker has stopped smoking for at least 15 years. Yearly screening should be stopped for people who develop a health problem that would prevent them from having lung cancer treatment.  If you are pregnant, do not drink alcohol. If you are breastfeeding,  be very cautious about drinking alcohol. If you are not pregnant and choose to drink alcohol, do not have more than 1 drink per day. One drink is considered to be 12 ounces (355 mL) of beer, 5 ounces (148 mL) of wine, or 1.5 ounces (44 mL) of liquor.  Avoid use of street drugs. Do not share needles with anyone. Ask for help if you need support or instructions about stopping the use of drugs.  High blood pressure causes heart disease and increases the risk of  stroke. Your blood pressure should be checked at least every 1 to 2 years. Ongoing high blood pressure should be treated with medicines if weight loss and exercise do not work.  If you are 75-52 years old, ask your health care provider if you should take aspirin to prevent strokes.  Diabetes screening involves taking a blood sample to check your fasting blood sugar level. This should be done once every 3 years, after age 15, if you are within normal weight and without risk factors for diabetes. Testing should be considered at a younger age or be carried out more frequently if you are overweight and have at least 1 risk factor for diabetes.  Breast cancer screening is essential preventive care for women. You should practice "breast self-awareness." This means understanding the normal appearance and feel of your breasts and may include breast self-examination. Any changes detected, no matter how small, should be reported to a health care provider. Women in their 58s and 30s should have a clinical breast exam (CBE) by a health care provider as part of a regular health exam every 1 to 3 years. After age 16, women should have a CBE every year. Starting at age 53, women should consider having a mammogram (breast X-ray test) every year. Women who have a family history of breast cancer should talk to their health care provider about genetic screening. Women at a high risk of breast cancer should talk to their health care providers about having an MRI and a mammogram every year.  Breast cancer gene (BRCA)-related cancer risk assessment is recommended for women who have family members with BRCA-related cancers. BRCA-related cancers include breast, ovarian, tubal, and peritoneal cancers. Having family members with these cancers may be associated with an increased risk for harmful changes (mutations) in the breast cancer genes BRCA1 and BRCA2. Results of the assessment will determine the need for genetic counseling and  BRCA1 and BRCA2 testing.  Routine pelvic exams to screen for cancer are no longer recommended for nonpregnant women who are considered low risk for cancer of the pelvic organs (ovaries, uterus, and vagina) and who do not have symptoms. Ask your health care provider if a screening pelvic exam is right for you.  If you have had past treatment for cervical cancer or a condition that could lead to cancer, you need Pap tests and screening for cancer for at least 20 years after your treatment. If Pap tests have been discontinued, your risk factors (such as having a new sexual partner) need to be reassessed to determine if screening should be resumed. Some women have medical problems that increase the chance of getting cervical cancer. In these cases, your health care provider may recommend more frequent screening and Pap tests.  The HPV test is an additional test that may be used for cervical cancer screening. The HPV test looks for the virus that can cause the cell changes on the cervix. The cells collected during the Pap test can be  tested for HPV. The HPV test could be used to screen women aged 30 years and older, and should be used in women of any age who have unclear Pap test results. After the age of 30, women should have HPV testing at the same frequency as a Pap test.  Colorectal cancer can be detected and often prevented. Most routine colorectal cancer screening begins at the age of 50 years and continues through age 75 years. However, your health care provider may recommend screening at an earlier age if you have risk factors for colon cancer. On a yearly basis, your health care provider may provide home test kits to check for hidden blood in the stool. Use of a small camera at the end of a tube, to directly examine the colon (sigmoidoscopy or colonoscopy), can detect the earliest forms of colorectal cancer. Talk to your health care provider about this at age 50, when routine screening begins. Direct  exam of the colon should be repeated every 5-10 years through age 75 years, unless early forms of pre-cancerous polyps or small growths are found.  People who are at an increased risk for hepatitis B should be screened for this virus. You are considered at high risk for hepatitis B if:  You were born in a country where hepatitis B occurs often. Talk with your health care provider about which countries are considered high risk.  Your parents were born in a high-risk country and you have not received a shot to protect against hepatitis B (hepatitis B vaccine).  You have HIV or AIDS.  You use needles to inject street drugs.  You live with, or have sex with, someone who has hepatitis B.  You get hemodialysis treatment.  You take certain medicines for conditions like cancer, organ transplantation, and autoimmune conditions.  Hepatitis C blood testing is recommended for all people born from 1945 through 1965 and any individual with known risks for hepatitis C.  Practice safe sex. Use condoms and avoid high-risk sexual practices to reduce the spread of sexually transmitted infections (STIs). STIs include gonorrhea, chlamydia, syphilis, trichomonas, herpes, HPV, and human immunodeficiency virus (HIV). Herpes, HIV, and HPV are viral illnesses that have no cure. They can result in disability, cancer, and death.  You should be screened for sexually transmitted illnesses (STIs) including gonorrhea and chlamydia if:  You are sexually active and are younger than 24 years.  You are older than 24 years and your health care provider tells you that you are at risk for this type of infection.  Your sexual activity has changed since you were last screened and you are at an increased risk for chlamydia or gonorrhea. Ask your health care provider if you are at risk.  If you are at risk of being infected with HIV, it is recommended that you take a prescription medicine daily to prevent HIV infection. This is  called preexposure prophylaxis (PrEP). You are considered at risk if:  You are a heterosexual woman, are sexually active, and are at increased risk for HIV infection.  You take drugs by injection.  You are sexually active with a partner who has HIV.  Talk with your health care provider about whether you are at high risk of being infected with HIV. If you choose to begin PrEP, you should first be tested for HIV. You should then be tested every 3 months for as long as you are taking PrEP.  Osteoporosis is a disease in which the bones lose minerals and strength   with aging. This can result in serious bone fractures or breaks. The risk of osteoporosis can be identified using a bone density scan. Women ages 65 years and over and women at risk for fractures or osteoporosis should discuss screening with their health care providers. Ask your health care provider whether you should take a calcium supplement or vitamin D to reduce the rate of osteoporosis.  Menopause can be associated with physical symptoms and risks. Hormone replacement therapy is available to decrease symptoms and risks. You should talk to your health care provider about whether hormone replacement therapy is right for you.  Use sunscreen. Apply sunscreen liberally and repeatedly throughout the day. You should seek shade when your shadow is shorter than you. Protect yourself by wearing long sleeves, pants, a wide-brimmed hat, and sunglasses year round, whenever you are outdoors.  Once a month, do a whole body skin exam, using a mirror to look at the skin on your back. Tell your health care provider of new moles, moles that have irregular borders, moles that are larger than a pencil eraser, or moles that have changed in shape or color.  Stay current with required vaccines (immunizations).  Influenza vaccine. All adults should be immunized every year.  Tetanus, diphtheria, and acellular pertussis (Td, Tdap) vaccine. Pregnant women should  receive 1 dose of Tdap vaccine during each pregnancy. The dose should be obtained regardless of the length of time since the last dose. Immunization is preferred during the 27th-36th week of gestation. An adult who has not previously received Tdap or who does not know her vaccine status should receive 1 dose of Tdap. This initial dose should be followed by tetanus and diphtheria toxoids (Td) booster doses every 10 years. Adults with an unknown or incomplete history of completing a 3-dose immunization series with Td-containing vaccines should begin or complete a primary immunization series including a Tdap dose. Adults should receive a Td booster every 10 years.  Varicella vaccine. An adult without evidence of immunity to varicella should receive 2 doses or a second dose if she has previously received 1 dose. Pregnant females who do not have evidence of immunity should receive the first dose after pregnancy. This first dose should be obtained before leaving the health care facility. The second dose should be obtained 4-8 weeks after the first dose.  Human papillomavirus (HPV) vaccine. Females aged 13-26 years who have not received the vaccine previously should obtain the 3-dose series. The vaccine is not recommended for use in pregnant females. However, pregnancy testing is not needed before receiving a dose. If a female is found to be pregnant after receiving a dose, no treatment is needed. In that case, the remaining doses should be delayed until after the pregnancy. Immunization is recommended for any person with an immunocompromised condition through the age of 26 years if she did not get any or all doses earlier. During the 3-dose series, the second dose should be obtained 4-8 weeks after the first dose. The third dose should be obtained 24 weeks after the first dose and 16 weeks after the second dose.  Zoster vaccine. One dose is recommended for adults aged 60 years or older unless certain conditions are  present.  Measles, mumps, and rubella (MMR) vaccine. Adults born before 1957 generally are considered immune to measles and mumps. Adults born in 1957 or later should have 1 or more doses of MMR vaccine unless there is a contraindication to the vaccine or there is laboratory evidence of immunity to   each of the three diseases. A routine second dose of MMR vaccine should be obtained at least 28 days after the first dose for students attending postsecondary schools, health care workers, or international travelers. People who received inactivated measles vaccine or an unknown type of measles vaccine during 1963-1967 should receive 2 doses of MMR vaccine. People who received inactivated mumps vaccine or an unknown type of mumps vaccine before 1979 and are at high risk for mumps infection should consider immunization with 2 doses of MMR vaccine. For females of childbearing age, rubella immunity should be determined. If there is no evidence of immunity, females who are not pregnant should be vaccinated. If there is no evidence of immunity, females who are pregnant should delay immunization until after pregnancy. Unvaccinated health care workers born before 1957 who lack laboratory evidence of measles, mumps, or rubella immunity or laboratory confirmation of disease should consider measles and mumps immunization with 2 doses of MMR vaccine or rubella immunization with 1 dose of MMR vaccine.  Pneumococcal 13-valent conjugate (PCV13) vaccine. When indicated, a person who is uncertain of her immunization history and has no record of immunization should receive the PCV13 vaccine. An adult aged 19 years or older who has certain medical conditions and has not been previously immunized should receive 1 dose of PCV13 vaccine. This PCV13 should be followed with a dose of pneumococcal polysaccharide (PPSV23) vaccine. The PPSV23 vaccine dose should be obtained at least 8 weeks after the dose of PCV13 vaccine. An adult aged 19  years or older who has certain medical conditions and previously received 1 or more doses of PPSV23 vaccine should receive 1 dose of PCV13. The PCV13 vaccine dose should be obtained 1 or more years after the last PPSV23 vaccine dose.  Pneumococcal polysaccharide (PPSV23) vaccine. When PCV13 is also indicated, PCV13 should be obtained first. All adults aged 65 years and older should be immunized. An adult younger than age 65 years who has certain medical conditions should be immunized. Any person who resides in a nursing home or long-term care facility should be immunized. An adult smoker should be immunized. People with an immunocompromised condition and certain other conditions should receive both PCV13 and PPSV23 vaccines. People with human immunodeficiency virus (HIV) infection should be immunized as soon as possible after diagnosis. Immunization during chemotherapy or radiation therapy should be avoided. Routine use of PPSV23 vaccine is not recommended for American Indians, [place] Natives, or people younger than 65 years unless there are medical conditions that require PPSV23 vaccine. When indicated, people who have unknown immunization and have no record of immunization should receive PPSV23 vaccine. One-time revaccination 5 years after the first dose of PPSV23 is recommended for people aged 19-64 years who have chronic kidney failure, nephrotic syndrome, asplenia, or immunocompromised conditions. People who received 1-2 doses of PPSV23 before age 65 years should receive another dose of PPSV23 vaccine at age 65 years or later if at least 5 years have passed since the previous dose. Doses of PPSV23 are not needed for people immunized with PPSV23 at or after age 65 years.  Meningococcal vaccine. Adults with asplenia or persistent complement component deficiencies should receive 2 doses of quadrivalent meningococcal conjugate (MenACWY-D) vaccine. The doses should be obtained at least 2 months apart.  Microbiologists working with certain meningococcal bacteria, military recruits, people at risk during an outbreak, and people who travel to or live in countries with a high rate of meningitis should be immunized. A first-year college student up through age   21 years who is living in a residence hall should receive a dose if she did not receive a dose on or after her 16th birthday. Adults who have certain high-risk conditions should receive one or more doses of vaccine.  Hepatitis A vaccine. Adults who wish to be protected from this disease, have certain high-risk conditions, work with hepatitis A-infected animals, work in hepatitis A research labs, or travel to or work in countries with a high rate of hepatitis A should be immunized. Adults who were previously unvaccinated and who anticipate close contact with an international adoptee during the first 60 days after arrival in the [place] from a country with a high rate of hepatitis A should be immunized.  Hepatitis B vaccine. Adults who wish to be protected from this disease, have certain high-risk conditions, may be exposed to blood or other infectious body fluids, are household contacts or sex partners of hepatitis B positive people, are clients or workers in certain care facilities, or travel to or work in countries with a high rate of hepatitis B should be immunized.  Haemophilus influenzae type b (Hib) vaccine. A previously unvaccinated person with asplenia or sickle cell disease or having a scheduled splenectomy should receive 1 dose of Hib vaccine. Regardless of previous immunization, a recipient of a hematopoietic stem cell transplant should receive a 3-dose series 6-12 months after her successful transplant. Hib vaccine is not recommended for adults with HIV infection. Preventive Services / Frequency Ages 64 to 68 years  Blood pressure check.** / Every 1 to 2 years.  Lipid and cholesterol check.** / Every 5 years beginning at age  22.  Clinical breast exam.** / Every 3 years for women in their 88s and 53s.  BRCA-related cancer risk assessment.** / For women who have family members with a BRCA-related cancer (breast, ovarian, tubal, or peritoneal cancers).  Pap test.** / Every 2 years from ages 90 through 51. Every 3 years starting at age 21 through age 56 or 3 with a history of 3 consecutive normal Pap tests.  HPV screening.** / Every 3 years from ages 24 through ages 1 to 46 with a history of 3 consecutive normal Pap tests.  Hepatitis C blood test.** / For any individual with known risks for hepatitis C.  Skin self-exam. / Monthly.  Influenza vaccine. / Every year.  Tetanus, diphtheria, and acellular pertussis (Tdap, Td) vaccine.** / Consult your health care provider. Pregnant women should receive 1 dose of Tdap vaccine during each pregnancy. 1 dose of Td every 10 years.  Varicella vaccine.** / Consult your health care provider. Pregnant females who do not have evidence of immunity should receive the first dose after pregnancy.  HPV vaccine. / 3 doses over 6 months, if 72 and younger. The vaccine is not recommended for use in pregnant females. However, pregnancy testing is not needed before receiving a dose.  Measles, mumps, rubella (MMR) vaccine.** / You need at least 1 dose of MMR if you were born in 1957 or later. You may also need a 2nd dose. For females of childbearing age, rubella immunity should be determined. If there is no evidence of immunity, females who are not pregnant should be vaccinated. If there is no evidence of immunity, females who are pregnant should delay immunization until after pregnancy.  Pneumococcal 13-valent conjugate (PCV13) vaccine.** / Consult your health care provider.  Pneumococcal polysaccharide (PPSV23) vaccine.** / 1 to 2 doses if you smoke cigarettes or if you have certain conditions.  Meningococcal vaccine.** /  1 dose if you are age 19 to 21 years and a first-year college  student living in a residence hall, or have one of several medical conditions, you need to get vaccinated against meningococcal disease. You may also need additional booster doses.  Hepatitis A vaccine.** / Consult your health care provider.  Hepatitis B vaccine.** / Consult your health care provider.  Haemophilus influenzae type b (Hib) vaccine.** / Consult your health care provider. Ages 40 to 64 years  Blood pressure check.** / Every 1 to 2 years.  Lipid and cholesterol check.** / Every 5 years beginning at age 20 years.  Lung cancer screening. / Every year if you are aged 55-80 years and have a 30-pack-year history of smoking and currently smoke or have quit within the past 15 years. Yearly screening is stopped once you have quit smoking for at least 15 years or develop a health problem that would prevent you from having lung cancer treatment.  Clinical breast exam.** / Every year after age 40 years.  BRCA-related cancer risk assessment.** / For women who have family members with a BRCA-related cancer (breast, ovarian, tubal, or peritoneal cancers).  Mammogram.** / Every year beginning at age 40 years and continuing for as long as you are in good health. Consult with your health care provider.  Pap test.** / Every 3 years starting at age 30 years through age 65 or 70 years with a history of 3 consecutive normal Pap tests.  HPV screening.** / Every 3 years from ages 30 years through ages 65 to 70 years with a history of 3 consecutive normal Pap tests.  Fecal occult blood test (FOBT) of stool. / Every year beginning at age 50 years and continuing until age 75 years. You may not need to do this test if you get a colonoscopy every 10 years.  Flexible sigmoidoscopy or colonoscopy.** / Every 5 years for a flexible sigmoidoscopy or every 10 years for a colonoscopy beginning at age 50 years and continuing until age 75 years.  Hepatitis C blood test.** / For all people born from 1945 through  1965 and any individual with known risks for hepatitis C.  Skin self-exam. / Monthly.  Influenza vaccine. / Every year.  Tetanus, diphtheria, and acellular pertussis (Tdap/Td) vaccine.** / Consult your health care provider. Pregnant women should receive 1 dose of Tdap vaccine during each pregnancy. 1 dose of Td every 10 years.  Varicella vaccine.** / Consult your health care provider. Pregnant females who do not have evidence of immunity should receive the first dose after pregnancy.  Zoster vaccine.** / 1 dose for adults aged 60 years or older.  Measles, mumps, rubella (MMR) vaccine.** / You need at least 1 dose of MMR if you were born in 1957 or later. You may also need a 2nd dose. For females of childbearing age, rubella immunity should be determined. If there is no evidence of immunity, females who are not pregnant should be vaccinated. If there is no evidence of immunity, females who are pregnant should delay immunization until after pregnancy.  Pneumococcal 13-valent conjugate (PCV13) vaccine.** / Consult your health care provider.  Pneumococcal polysaccharide (PPSV23) vaccine.** / 1 to 2 doses if you smoke cigarettes or if you have certain conditions.  Meningococcal vaccine.** / Consult your health care provider.  Hepatitis A vaccine.** / Consult your health care provider.  Hepatitis B vaccine.** / Consult your health care provider.  Haemophilus influenzae type b (Hib) vaccine.** / Consult your health care provider. Ages 65   years and over  Blood pressure check.** / Every 1 to 2 years.  Lipid and cholesterol check.** / Every 5 years beginning at age 22 years.  Lung cancer screening. / Every year if you are aged 73-80 years and have a 30-pack-year history of smoking and currently smoke or have quit within the past 15 years. Yearly screening is stopped once you have quit smoking for at least 15 years or develop a health problem that would prevent you from having lung cancer  treatment.  Clinical breast exam.** / Every year after age 4 years.  BRCA-related cancer risk assessment.** / For women who have family members with a BRCA-related cancer (breast, ovarian, tubal, or peritoneal cancers).  Mammogram.** / Every year beginning at age 40 years and continuing for as long as you are in good health. Consult with your health care provider.  Pap test.** / Every 3 years starting at age 9 years through age 34 or 91 years with 3 consecutive normal Pap tests. Testing can be stopped between 65 and 70 years with 3 consecutive normal Pap tests and no abnormal Pap or HPV tests in the past 10 years.  HPV screening.** / Every 3 years from ages 57 years through ages 64 or 45 years with a history of 3 consecutive normal Pap tests. Testing can be stopped between 65 and 70 years with 3 consecutive normal Pap tests and no abnormal Pap or HPV tests in the past 10 years.  Fecal occult blood test (FOBT) of stool. / Every year beginning at age 15 years and continuing until age 17 years. You may not need to do this test if you get a colonoscopy every 10 years.  Flexible sigmoidoscopy or colonoscopy.** / Every 5 years for a flexible sigmoidoscopy or every 10 years for a colonoscopy beginning at age 86 years and continuing until age 71 years.  Hepatitis C blood test.** / For all people born from [birth year] through 1967 and any individual with known risks for hepatitis C.  Osteoporosis screening.** / A one-time screening for women ages 83 years and over and women at risk for fractures or osteoporosis.  Skin self-exam. / Monthly.  Influenza vaccine. / Every year.  Tetanus, diphtheria, and acellular pertussis (Tdap/Td) vaccine.** / 1 dose of Td every 10 years.  Varicella vaccine.** / Consult your health care provider.  Zoster vaccine.** / 1 dose for adults aged 61 years or older.  Pneumococcal 13-valent conjugate (PCV13) vaccine.** / Consult your health care provider.  Pneumococcal  polysaccharide (PPSV23) vaccine.** / 1 dose for all adults aged 28 years and older.  Meningococcal vaccine.** / Consult your health care provider.  Hepatitis A vaccine.** / Consult your health care provider.  Hepatitis B vaccine.** / Consult your health care provider.  Haemophilus influenzae type b (Hib) vaccine.** / Consult your health care provider. ** Family history and personal history of risk and conditions may change your health care provider's recommendations. Document Released: 04/01/2001 Document Revised: 06/20/2013 Document Reviewed: 07/01/2010 [facility] Patient Information 2015 [place], [place]. This information is not intended to replace advice given to you by your health care provider. Make sure you discuss any questions you have with your health care provider.

## 2014-07-25 NOTE — Progress Notes (Signed)
Pre visit review using our clinic review tool, if applicable. No additional management support is needed unless otherwise documented below in the visit note. 

## 2014-07-27 ENCOUNTER — Ambulatory Visit: Payer: 59 | Admitting: Family Medicine

## 2014-08-06 ENCOUNTER — Encounter: Payer: Self-pay | Admitting: Family Medicine

## 2014-08-06 DIAGNOSIS — E039 Hypothyroidism, unspecified: Secondary | ICD-10-CM

## 2014-08-06 DIAGNOSIS — E119 Type 2 diabetes mellitus without complications: Secondary | ICD-10-CM

## 2014-08-06 DIAGNOSIS — Z0000 Encounter for general adult medical examination without abnormal findings: Secondary | ICD-10-CM

## 2014-08-06 DIAGNOSIS — R109 Unspecified abdominal pain: Secondary | ICD-10-CM

## 2014-08-06 DIAGNOSIS — R1084 Generalized abdominal pain: Secondary | ICD-10-CM | POA: Insufficient documentation

## 2014-08-06 HISTORY — DX: Type 2 diabetes mellitus without complications: E11.9

## 2014-08-06 HISTORY — DX: Encounter for general adult medical examination without abnormal findings: Z00.00

## 2014-08-06 HISTORY — DX: Unspecified abdominal pain: R10.9

## 2014-08-06 HISTORY — DX: Hypothyroidism, unspecified: E03.9

## 2014-08-06 NOTE — Assessment & Plan Note (Signed)
Back pain worse with new job. Encouraged moist heat and gentle stretching as tolerated. May try NSAIDs and prescription meds as directed and report if symptoms worsen or seek immediate care

## 2014-08-06 NOTE — Assessment & Plan Note (Signed)
On Levothyroxine, continue to monitor 

## 2014-08-06 NOTE — Assessment & Plan Note (Addendum)
Follows with endocrinology reports A1C every 4 months, diet controlled. Will request records EKG today wnl

## 2014-08-06 NOTE — Assessment & Plan Note (Signed)
Encouraged vit d and calcium supplements and regular exercise

## 2014-08-06 NOTE — Assessment & Plan Note (Signed)
Encouraged 2000 IU qd

## 2014-08-06 NOTE — Assessment & Plan Note (Signed)
Intermittent, vague symptoms. Encouraged clean diet, adequate hydration, add a probiotic and fiber supplement and report if symptoms worsen

## 2014-08-06 NOTE — Assessment & Plan Note (Signed)
Patient encouraged to maintain heart healthy diet, regular exercise, adequate sleep. Consider daily probiotics. Take medications as prescribed. Labs ordered and reviewed.  Follows with endocrinology Dr Talmage Nap [facility], Dr Juanda Chance [facility] for paps, Dexa Scans and [facility] Given and reviewed copy of ACP documents from [facility] Secretary of State and encouraged to complete and return

## 2014-08-06 NOTE — Assessment & Plan Note (Signed)
Encouraged heart healthy diet, increase exercise, avoid trans fats, consider a krill oil cap daily 

## 2014-09-08 ENCOUNTER — Telehealth: Payer: Self-pay | Admitting: Family Medicine

## 2014-09-08 NOTE — Telephone Encounter (Signed)
Please advise. Nothing mentioned in previous note. Thank you.

## 2014-09-08 NOTE — Telephone Encounter (Signed)
Caller name:Tatym Relationship to patient:self Can be reached:[phone] Pharmacy:  Reason for call:calling back about referral for Cardiology, do not see referral. Needs medical recommendation for adjustable work station.

## 2014-09-09 ENCOUNTER — Other Ambulatory Visit: Payer: Self-pay | Admitting: Family Medicine

## 2014-09-09 ENCOUNTER — Encounter: Payer: Self-pay | Admitting: Family Medicine

## 2014-09-09 DIAGNOSIS — E119 Type 2 diabetes mellitus without complications: Secondary | ICD-10-CM

## 2014-09-09 DIAGNOSIS — R1013 Epigastric pain: Secondary | ICD-10-CM

## 2014-09-09 NOTE — Telephone Encounter (Signed)
I have written her letter, please print and get it to her and I referred her to cardiology at her request

## 2014-09-11 NOTE — Telephone Encounter (Signed)
Letter printed, mailed to patient.  Notified patient of letter and referral via voicemail.

## 2014-09-12 ENCOUNTER — Encounter (HOSPITAL_COMMUNITY): Payer: 59

## 2014-09-19 ENCOUNTER — Telehealth: Payer: Self-pay

## 2014-09-19 NOTE — Telephone Encounter (Signed)
I did place order, I think she might be the patient cardiology questioned. Please confirm with cardiology I would like consultation. Patient with risk factors that include, DM, hyperlipidemia, hypothyroidism and mild EKG changes. Patient would like consultation

## 2014-09-19 NOTE — Telephone Encounter (Signed)
Pt had an EKG done by [place] and Dr. Abner Greenspan recommended she f/u with cardiology for a work up. Pt has family history of heart disease/problems . Pt does have hyperlipidemia and DM II. Pt states has constant back pain due to scoliosis. No palpitations, chest pain, epigastric pain or SOB. Please advise on what should be done next for her referral.

## 2014-09-19 NOTE — Telephone Encounter (Signed)
Spoke with Cardiology  They scheduled the patient for 10-13-2014  with Chilton Si at [facility]

## 2014-10-12 NOTE — Progress Notes (Signed)
Cardiology Office Note   Date:  10/13/2014   ID:  Kelli Barry, DOB 01-Aug-1965, MRN [id number]  PCP:  Danise Edge, MD  Cardiologist:   Madilyn Hook, MD   Chief Complaint  Patient presents with  . Follow-up    light headed only when sugar drops  . Chest Pain    isn't sure if its coming from scoliosis  . Edema    sometimes      History of Present Illness: Kelli Barry is a 49 y.o. female with DM type 2, HL, and hypothyroidism who presents for an evaluation of CP.  Kelli Barry reports several episodes of chest tightness. The episodes her chest pressure that occur at rest. It is slightly better with standing up and walking around. Sometimes it is associated with palpitations. There is no associated shortness of breath, diaphoresis, nausea, vomiting, lightheadedness or dizziness. The episodes last for 20-30 minutes.  Kelli Barry was evaluated by her PCP, Dr. Joaquin Courts on 07/25/14.  EKG showed sinus rhythm at 62 bpm and low voltage in the limb leads.   There was also a report of a rightward p wave axis and possible pulmonary disease.  Given her symptoms and extensive family history of cardiac disease, Dr. Rogelia Rohrer referred her for cardiology evaluation.  Kelli Barry aunt recent died suddenly of a heart attack at age 38.  She has no family history of premature cardiac disease.  Kelli Barry also reports intermittent lower extremity edema. This has gotten worse and she started a new job where she mostly sits all day. The edema is better with elevation. She denies any orthopnea or PND.  She has not been exercising recently due to long hours at work. She is hoping to increase her exercise once she notes that it is safe for her to do so.    Past Medical History  Diagnosis Date  . Acute bronchitis 12/15/2010  . Allergy     seasonal  . Diabetes mellitus without complication     diet controlled  . Hyperlipidemia   . Osteoporosis   . Hypothyroidism 08/06/2014  .  Diabetes type 2, controlled 08/06/2014    Follows with Dr Talmage Nap  . Preventative health care 08/06/2014  . Abdominal pain 08/06/2014    Past Surgical History  Procedure Laterality Date  . No past surgeries       Current Outpatient Prescriptions  Medication Sig Dispense Refill  . estradiol (VIVELLE-DOT) 0.05 MG/24HR patch Place 1 patch onto the skin 2 (two) times a week.    . levocetirizine (XYZAL) 5 MG tablet Take 5 mg by mouth every evening.    Marland Kitchen levothyroxine (SYNTHROID, LEVOTHROID) 88 MCG tablet Take 88 mcg by mouth daily before breakfast.    . ONE TOUCH ULTRA TEST test strip 1 strip.  4  . Progesterone Micronized (PROGESTERONE PO) Take by mouth.    . risedronate (ACTONEL) 35 MG tablet Take 35 mg by mouth once a week. osteoporosis  8   No current facility-administered medications for this visit.    Allergies:   Codeine and Penicillins    Social History:  The patient  reports that she has never smoked. She has never used smokeless tobacco. She reports that she drinks alcohol. She reports that she does not use illicit drugs.   Family History:  The patient's family history includes Allergies in her son; Cancer in her maternal grandmother and paternal grandfather; Colon cancer (age of onset: 82) in her maternal grandmother; Coronary artery disease in  her maternal grandmother; Dementia in her father and paternal grandmother; Diabetes in her father; Emphysema in her paternal grandfather; Fibroids in her mother; Heart attack in her maternal grandmother; Heart disease in her maternal grandfather; Heart disease (age of onset: 35) in her maternal uncle; Heart disease (age of onset: 46) in her maternal uncle; Heart disease (age of onset: 40) in her maternal aunt; Hyperlipidemia in her mother; Hypertension in her father and mother; Melanoma in her father; Ovarian cysts in her sister; Stroke in her paternal grandmother.    ROS:  Please see the history of present illness.   Otherwise, review of  systems are positive for peripheral neuropathy, rash on feet and lower legs, varicose veins.   All other systems are reviewed and negative.    PHYSICAL EXAM: VS:  BP 104/66 mmHg  Pulse 66  Ht  (1.575 m)  Wt 47.492 kg (104 lb 11.2 oz)  BMI 19.15 kg/m2  LMP 01/14/2011 , BMI Body mass index is 19.15 kg/(m^2). GENERAL:  Well appearing HEENT:  Pupils equal round and reactive, fundi not visualized, oral mucosa unremarkable NECK:  No jugular venous distention, waveform within normal limits, carotid upstroke brisk and symmetric, no bruits, no thyromegaly LYMPHATICS:  No cervical adenopathy LUNGS:  Clear to auscultation bilaterally HEART:  RRR.  PMI not displaced or sustained,S1 and S2 within normal limits, no S3, no S4, no clicks, no rubs, no murmurs ABD:  Flat, positive bowel sounds normal in frequency in pitch, no bruits, no rebound, no guarding, no midline pulsatile mass, no hepatomegaly, no splenomegaly EXT:  2 plus pulses throughout, no edema, no cyanosis no clubbing SKIN:  No rashes no nodules NEURO:  Cranial nerves II through XII grossly intact, motor grossly intact throughout PSYCH:  Cognitively intact, oriented to person place and time    EKG:  EKG is not ordered today.  Personal review of ECG 07/25/14 shows: sinus rhythm at 62 bpm.  Low voltage in the limb leads.   Recent Labs: 07/25/2014: ALT 12; BUN 15; Creatinine, Ser 0.75; Hemoglobin 13.5; Platelets 245.0; Potassium 3.9; Sodium 136    Lipid Panel No results found for: CHOL, TRIG, HDL, CHOLHDL, VLDL, LDLCALC, LDLDIRECT    Wt Readings from Last 3 Encounters:  10/13/14 47.492 kg (104 lb 11.2 oz)  07/25/14 47.287 kg (104 lb 4 oz)  03/03/14 46.72 kg (103 lb)      Other studies Reviewed: Additional studies/ records that were reviewed today include: medical record. Review of the above records demonstrates:  Please see elsewhere in the note.  n/a   ASSESSMENT AND PLAN:  # Atypical chest pain: Kelli Barry chest  pain is atypical in that it is alleviated with walking and occurs at rest. However she has several cardiac risk factors including diabetes mellitus and family history of heart disease. Therefore we will proceed with cardiac stress testing.  - Treadmill stress test   # CV risk prevention/hyperlipidemia: Kelli Barry is no longer taking a statin.  Her endocrinologist manages her lipids.  She was told that she could stop taking this as her thyroid function was abnormal and she was recently started on levothyroxine.  I mentioned that most diabetic patients should be on a statin regardless of what their lipid panel shows.  She will discuss this with her endocrinologist. I also mentioned that if her ASCVD 10-year risk of CV disease is greater than or equal to 5%, she should also start aspirin  daily.  She expressed understanding and will address this with  her endocrinologist.   Current medicines are reviewed at length with the patient today.  The patient does not have concerns regarding medicines.  The following changes have been made:  no change  Labs/ tests ordered today include: exercise treadmill stress test.  No orders of the defined types were placed in this encounter.     Disposition:   FU with Dr. Elmarie Shiley C. Duke Salvia in 1 year    Signed, Madilyn Hook, MD  10/13/2014 9:32 AM    [facility]

## 2014-10-13 ENCOUNTER — Ambulatory Visit (INDEPENDENT_AMBULATORY_CARE_PROVIDER_SITE_OTHER): Payer: 59 | Admitting: Cardiovascular Disease

## 2014-10-13 ENCOUNTER — Encounter: Payer: Self-pay | Admitting: Cardiovascular Disease

## 2014-10-13 VITALS — BP 104/66 | HR 66 | Ht 62.0 in | Wt 104.7 lb

## 2014-10-13 DIAGNOSIS — R0789 Other chest pain: Secondary | ICD-10-CM

## 2014-10-13 NOTE — Patient Instructions (Signed)
Your physician has requested that you have an exercise tolerance test. For further information please visit [email or web address] Please also follow instruction sheet, as given. You will be notified of these results by phone call or mail.  Your physician wants you to follow-up in: 1 year or sooner if needed with Dr. Fara Chute will receive a reminder letter in the mail two months in advance. If you don't receive a letter, please call our office to schedule the follow-up appointment.

## 2014-10-30 LAB — HM MAMMOGRAPHY

## 2014-10-30 LAB — HM PAP SMEAR

## 2014-11-08 ENCOUNTER — Inpatient Hospital Stay (HOSPITAL_COMMUNITY): Admission: RE | Admit: 2014-11-08 | Payer: 59 | Source: Ambulatory Visit

## 2014-12-20 ENCOUNTER — Other Ambulatory Visit: Payer: 59

## 2015-01-29 ENCOUNTER — Encounter: Payer: Self-pay | Admitting: Behavioral Health

## 2015-01-29 ENCOUNTER — Telehealth: Payer: Self-pay | Admitting: Behavioral Health

## 2015-01-29 NOTE — Telephone Encounter (Signed)
Addressed the following care gaps with the patient: A1c Mammogram Cervical Cancer Screening Flu Shot  Patient reported that she had her mammogram and pap smear four months ago with [facility] Seymour [facility]avoie [facility]. Also, A1c labs were drawn at [facility] Willeen CassBalan's office[facility]. Labs have been requested. Flu shot was given at this same office as well. Updated health maintenance.

## 2015-01-30 ENCOUNTER — Encounter: Payer: Self-pay | Admitting: Behavioral Health

## 2015-01-30 NOTE — Telephone Encounter (Signed)
[facility] with lab results. Updated the hemoglobin A1c under health maintenance.

## 2015-10-04 ENCOUNTER — Encounter: Payer: Self-pay | Admitting: Family Medicine

## 2015-10-04 ENCOUNTER — Ambulatory Visit (INDEPENDENT_AMBULATORY_CARE_PROVIDER_SITE_OTHER): Payer: Managed Care, Other (non HMO) | Admitting: Family Medicine

## 2015-10-04 VITALS — BP 102/72 | HR 73 | Temp 98.2°F | Ht 61.0 in | Wt 100.4 lb

## 2015-10-04 DIAGNOSIS — M899 Disorder of bone, unspecified: Secondary | ICD-10-CM | POA: Diagnosis not present

## 2015-10-04 DIAGNOSIS — J020 Streptococcal pharyngitis: Secondary | ICD-10-CM | POA: Diagnosis not present

## 2015-10-04 DIAGNOSIS — E119 Type 2 diabetes mellitus without complications: Secondary | ICD-10-CM

## 2015-10-04 DIAGNOSIS — Z0000 Encounter for general adult medical examination without abnormal findings: Secondary | ICD-10-CM

## 2015-10-04 DIAGNOSIS — M949 Disorder of cartilage, unspecified: Secondary | ICD-10-CM

## 2015-10-04 LAB — POCT RAPID STREP A (OFFICE): Rapid Strep A Screen: NEGATIVE

## 2015-10-04 NOTE — Assessment & Plan Note (Signed)
Encouraged to get adequate exercise, calcium and vitamin d intake 

## 2015-10-04 NOTE — Assessment & Plan Note (Addendum)
hgba1c acceptable, minimize simple carbs. Increase exercise as tolerated. Continue current meds. Follows with Dr Talmage NapBalan. Start ECASA 81 mg daily

## 2015-10-04 NOTE — Progress Notes (Signed)
Pre visit review using our clinic review tool, if applicable. No additional management support is needed unless otherwise documented below in the visit note. 

## 2015-10-04 NOTE — Patient Instructions (Addendum)
Continye Pysllium husk twice daily and consider [employer] 10 strain probiotic caps, 1 cap daily Luckyvitamins.Kelli Barry, aged or black garlic, Vitamin C 500 to 1000 mg daily, Zinc   Preventive Care for Adults, Female A healthy lifestyle and preventive care can promote health and wellness. Preventive health guidelines for women include the following key practices.  A routine yearly physical is a good way to check with your health care provider about your health and preventive screening. It is a chance to share any concerns and updates on your health and to receive a thorough exam.  Visit your dentist for a routine exam and preventive care every 6 months. Brush your teeth twice a day and floss once a day. Good oral hygiene prevents tooth decay and gum disease.  The frequency of eye exams is based on your age, health, family medical history, use of contact lenses, and other factors. Follow your health care provider's recommendations for frequency of eye exams.  Eat a healthy diet. Foods like vegetables, fruits, whole grains, low-fat dairy products, and lean protein foods contain the nutrients you need without too many calories. Decrease your intake of foods high in solid fats, added sugars, and salt. Eat the right amount of calories for you.Get information about a proper diet from your health care provider, if necessary.  Regular physical exercise is one of the most important things you can do for your health. Most adults should get at least 150 minutes of moderate-intensity exercise (any activity that increases your heart rate and causes you to sweat) each week. In addition, most adults need muscle-strengthening exercises on 2 or more days a week.  Maintain a healthy weight. The body mass index (BMI) is a screening tool to identify possible weight problems. It provides an estimate of body fat based on height and weight. Your health care provider can find your BMI and can help you achieve or  maintain a healthy weight.For adults 20 years and older:  A BMI below 18.5 is considered underweight.  A BMI of 18.5 to 24.9 is normal.  A BMI of 25 to 29.9 is considered overweight.  A BMI of 30 and above is considered obese.  Maintain normal blood lipids and cholesterol levels by exercising and minimizing your intake of saturated fat. Eat a balanced diet with plenty of fruit and vegetables. Blood tests for lipids and cholesterol should begin at age 27 and be repeated every 5 years. If your lipid or cholesterol levels are high, you are over 50, or you are at high risk for heart disease, you may need your cholesterol levels checked more frequently.Ongoing high lipid and cholesterol levels should be treated with medicines if diet and exercise are not working.  If you smoke, find out from your health care provider how to quit. If you do not use tobacco, do not start.  Lung cancer screening is recommended for adults aged 55-80 years who are at high risk for developing lung cancer because of a history of smoking. A yearly low-dose CT scan of the lungs is recommended for people who have at least a 30-pack-year history of smoking and are a current smoker or have quit within the past 15 years. A pack year of smoking is smoking an average of 1 pack of cigarettes a day for 1 year (for example: 1 pack a day for 30 years or 2 packs a day for 15 years). Yearly screening should continue until the smoker has stopped smoking for at least 15 years. Yearly  screening should be stopped for people who develop a health problem that would prevent them from having lung cancer treatment.  If you are pregnant, do not drink alcohol. If you are breastfeeding, be very cautious about drinking alcohol. If you are not pregnant and choose to drink alcohol, do not have more than 1 drink per day. One drink is considered to be 12 ounces (355 mL) of beer, 5 ounces (148 mL) of wine, or 1.5 ounces (44 mL) of liquor.  Avoid use of  street drugs. Do not share needles with anyone. Ask for help if you need support or instructions about stopping the use of drugs.  High blood pressure causes heart disease and increases the risk of stroke. Your blood pressure should be checked at least every 1 to 2 years. Ongoing high blood pressure should be treated with medicines if weight loss and exercise do not work.  If you are 82-27 years old, ask your health care provider if you should take aspirin to prevent strokes.  Diabetes screening is done by taking a blood sample to check your blood glucose level after you have not eaten for a certain period of time (fasting). If you are not overweight and you do not have risk factors for diabetes, you should be screened once every 3 years starting at age 47. If you are overweight or obese and you are 24-33 years of age, you should be screened for diabetes every year as part of your cardiovascular risk assessment.  Breast cancer screening is essential preventive care for women. You should practice "breast self-awareness." This means understanding the normal appearance and feel of your breasts and may include breast self-examination. Any changes detected, no matter how small, should be reported to a health care provider. Women in their 40s and 30s should have a clinical breast exam (CBE) by a health care provider as part of a regular health exam every 1 to 3 years. After age 72, women should have a CBE every year. Starting at age 12, women should consider having a mammogram (breast X-ray test) every year. Women who have a family history of breast cancer should talk to their health care provider about genetic screening. Women at a high risk of breast cancer should talk to their health care providers about having an MRI and a mammogram every year.  Breast cancer gene (BRCA)-related cancer risk assessment is recommended for women who have family members with BRCA-related cancers. BRCA-related cancers include  breast, ovarian, tubal, and peritoneal cancers. Having family members with these cancers may be associated with an increased risk for harmful changes (mutations) in the breast cancer genes BRCA1 and BRCA2. Results of the assessment will determine the need for genetic counseling and BRCA1 and BRCA2 testing.  Your health care provider may recommend that you be screened regularly for cancer of the pelvic organs (ovaries, uterus, and vagina). This screening involves a pelvic examination, including checking for microscopic changes to the surface of your cervix (Pap test). You may be encouraged to have this screening done every 3 years, beginning at age 72.  For women ages 33-65, health care providers may recommend pelvic exams and Pap testing every 3 years, or they may recommend the Pap and pelvic exam, combined with testing for human papilloma virus (HPV), every 5 years. Some types of HPV increase your risk of cervical cancer. Testing for HPV may also be done on women of any age with unclear Pap test results.  Other health care providers may not recommend  any screening for nonpregnant women who are considered low risk for pelvic cancer and who do not have symptoms. Ask your health care provider if a screening pelvic exam is right for you.  If you have had past treatment for cervical cancer or a condition that could lead to cancer, you need Pap tests and screening for cancer for at least 20 years after your treatment. If Pap tests have been discontinued, your risk factors (such as having a new sexual partner) need to be reassessed to determine if screening should resume. Some women have medical problems that increase the chance of getting cervical cancer. In these cases, your health care provider may recommend more frequent screening and Pap tests.  Colorectal cancer can be detected and often prevented. Most routine colorectal cancer screening begins at the age of 58 years and continues through age 21 years.  However, your health care provider may recommend screening at an earlier age if you have risk factors for colon cancer. On a yearly basis, your health care provider may provide home test kits to check for hidden blood in the stool. Use of a small camera at the end of a tube, to directly examine the colon (sigmoidoscopy or colonoscopy), can detect the earliest forms of colorectal cancer. Talk to your health care provider about this at age 8, when routine screening begins. Direct exam of the colon should be repeated every 5-10 years through age 66 years, unless early forms of precancerous polyps or small growths are found.  People who are at an increased risk for hepatitis B should be screened for this virus. You are considered at high risk for hepatitis B if:  You were born in a country where hepatitis B occurs often. Talk with your health care provider about which countries are considered high risk.  Your parents were born in a high-risk country and you have not received a shot to protect against hepatitis B (hepatitis B vaccine).  You have HIV or AIDS.  You use needles to inject street drugs.  You live with, or have sex with, someone who has hepatitis B.  You get hemodialysis treatment.  You take certain medicines for conditions like cancer, organ transplantation, and autoimmune conditions.  Hepatitis C blood testing is recommended for all people born from [birth year] through 1965 and any individual with known risks for hepatitis C.  Practice safe sex. Use condoms and avoid high-risk sexual practices to reduce the spread of sexually transmitted infections (STIs). STIs include gonorrhea, chlamydia, syphilis, trichomonas, herpes, HPV, and human immunodeficiency virus (HIV). Herpes, HIV, and HPV are viral illnesses that have no cure. They can result in disability, cancer, and death.  You should be screened for sexually transmitted illnesses (STIs) including gonorrhea and chlamydia if:  You are  sexually active and are younger than 24 years.  You are older than 24 years and your health care provider tells you that you are at risk for this type of infection.  Your sexual activity has changed since you were last screened and you are at an increased risk for chlamydia or gonorrhea. Ask your health care provider if you are at risk.  If you are at risk of being infected with HIV, it is recommended that you take a prescription medicine daily to prevent HIV infection. This is called preexposure prophylaxis (PrEP). You are considered at risk if:  You are sexually active and do not regularly use condoms or know the HIV status of your partner(s).  You take drugs by  injection.  You are sexually active with a partner who has HIV.  Talk with your health care provider about whether you are at high risk of being infected with HIV. If you choose to begin PrEP, you should first be tested for HIV. You should then be tested every 3 months for as long as you are taking PrEP.  Osteoporosis is a disease in which the bones lose minerals and strength with aging. This can result in serious bone fractures or breaks. The risk of osteoporosis can be identified using a bone density scan. Women ages 71 years and over and women at risk for fractures or osteoporosis should discuss screening with their health care providers. Ask your health care provider whether you should take a calcium supplement or vitamin D to reduce the rate of osteoporosis.  Menopause can be associated with physical symptoms and risks. Hormone replacement therapy is available to decrease symptoms and risks. You should talk to your health care provider about whether hormone replacement therapy is right for you.  Use sunscreen. Apply sunscreen liberally and repeatedly throughout the day. You should seek shade when your shadow is shorter than you. Protect yourself by wearing long sleeves, pants, a wide-brimmed hat, and sunglasses year round, whenever  you are outdoors.  Once a month, do a whole body skin exam, using a mirror to look at the skin on your back. Tell your health care provider of new moles, moles that have irregular borders, moles that are larger than a pencil eraser, or moles that have changed in shape or color.  Stay current with required vaccines (immunizations).  Influenza vaccine. All adults should be immunized every year.  Tetanus, diphtheria, and acellular pertussis (Td, Tdap) vaccine. Pregnant women should receive 1 dose of Tdap vaccine during each pregnancy. The dose should be obtained regardless of the length of time since the last dose. Immunization is preferred during the 27th-36th week of gestation. An adult who has not previously received Tdap or who does not know her vaccine status should receive 1 dose of Tdap. This initial dose should be followed by tetanus and diphtheria toxoids (Td) booster doses every 10 years. Adults with an unknown or incomplete history of completing a 3-dose immunization series with Td-containing vaccines should begin or complete a primary immunization series including a Tdap dose. Adults should receive a Td booster every 10 years.  Varicella vaccine. An adult without evidence of immunity to varicella should receive 2 doses or a second dose if she has previously received 1 dose. Pregnant females who do not have evidence of immunity should receive the first dose after pregnancy. This first dose should be obtained before leaving the health care facility. The second dose should be obtained 4-8 weeks after the first dose.  Human papillomavirus (HPV) vaccine. Females aged 13-26 years who have not received the vaccine previously should obtain the 3-dose series. The vaccine is not recommended for use in pregnant females. However, pregnancy testing is not needed before receiving a dose. If a female is found to be pregnant after receiving a dose, no treatment is needed. In that case, the remaining doses  should be delayed until after the pregnancy. Immunization is recommended for any person with an immunocompromised condition through the age of 73 years if she did not get any or all doses earlier. During the 3-dose series, the second dose should be obtained 4-8 weeks after the first dose. The third dose should be obtained 24 weeks after the first dose and 16 weeks  after the second dose.  Zoster vaccine. One dose is recommended for adults aged 57 years or older unless certain conditions are present.  Measles, mumps, and rubella (MMR) vaccine. Adults born before [birth year] generally are considered immune to measles and mumps. Adults born in [birth year] or later should have 1 or more doses of MMR vaccine unless there is a contraindication to the vaccine or there is laboratory evidence of immunity to each of the three diseases. A routine second dose of MMR vaccine should be obtained at least 28 days after the first dose for students attending postsecondary schools, health care workers, or international travelers. People who received inactivated measles vaccine or an unknown type of measles vaccine during 1963-1967 should receive 2 doses of MMR vaccine. People who received inactivated mumps vaccine or an unknown type of mumps vaccine before 1979 and are at high risk for mumps infection should consider immunization with 2 doses of MMR vaccine. For females of childbearing age, rubella immunity should be determined. If there is no evidence of immunity, females who are not pregnant should be vaccinated. If there is no evidence of immunity, females who are pregnant should delay immunization until after pregnancy. Unvaccinated health care workers born before 50 who lack laboratory evidence of measles, mumps, or rubella immunity or laboratory confirmation of disease should consider measles and mumps immunization with 2 doses of MMR vaccine or rubella immunization with 1 dose of MMR vaccine.  Pneumococcal 13-valent conjugate (PCV13)  vaccine. When indicated, a person who is uncertain of his immunization history and has no record of immunization should receive the PCV13 vaccine. All adults 1 years of age and older should receive this vaccine. An adult aged 28 years or older who has certain medical conditions and has not been previously immunized should receive 1 dose of PCV13 vaccine. This PCV13 should be followed with a dose of pneumococcal polysaccharide (PPSV23) vaccine. Adults who are at high risk for pneumococcal disease should obtain the PPSV23 vaccine at least 8 weeks after the dose of PCV13 vaccine. Adults older than 52 years of age who have normal immune system function should obtain the PPSV23 vaccine dose at least 1 year after the dose of PCV13 vaccine.  Pneumococcal polysaccharide (PPSV23) vaccine. When PCV13 is also indicated, PCV13 should be obtained first. All adults aged 74 years and older should be immunized. An adult younger than age 34 years who has certain medical conditions should be immunized. Any person who resides in a nursing home or long-term care facility should be immunized. An adult smoker should be immunized. People with an immunocompromised condition and certain other conditions should receive both PCV13 and PPSV23 vaccines. People with human immunodeficiency virus (HIV) infection should be immunized as soon as possible after diagnosis. Immunization during chemotherapy or radiation therapy should be avoided. Routine use of PPSV23 vaccine is not recommended for American Indians, [place] Natives, or people younger than 65 years unless there are medical conditions that require PPSV23 vaccine. When indicated, people who have unknown immunization and have no record of immunization should receive PPSV23 vaccine. One-time revaccination 5 years after the first dose of PPSV23 is recommended for people aged 19-64 years who have chronic kidney failure, nephrotic syndrome, asplenia, or immunocompromised conditions. People who  received 1-2 doses of PPSV23 before age 34 years should receive another dose of PPSV23 vaccine at age 24 years or later if at least 5 years have passed since the previous dose. Doses of PPSV23 are not needed for people immunized with PPSV23  at or after age 62 years.  Meningococcal vaccine. Adults with asplenia or persistent complement component deficiencies should receive 2 doses of quadrivalent meningococcal conjugate (MenACWY-D) vaccine. The doses should be obtained at least 2 months apart. Microbiologists working with certain meningococcal bacteria, [place] recruits, people at risk during an outbreak, and people who travel to or live in countries with a high rate of meningitis should be immunized. A first-year college student up through age 89 years who is living in a residence hall should receive a dose if she did not receive a dose on or after her 16th birthday. Adults who have certain high-risk conditions should receive one or more doses of vaccine.  Hepatitis A vaccine. Adults who wish to be protected from this disease, have certain high-risk conditions, work with hepatitis A-infected animals, work in hepatitis A research labs, or travel to or work in countries with a high rate of hepatitis A should be immunized. Adults who were previously unvaccinated and who anticipate close contact with an international adoptee during the first 60 days after arrival in the [place] from a country with a high rate of hepatitis A should be immunized.  Hepatitis B vaccine. Adults who wish to be protected from this disease, have certain high-risk conditions, may be exposed to blood or other infectious body fluids, are household contacts or sex partners of hepatitis B positive people, are clients or workers in certain care facilities, or travel to or work in countries with a high rate of hepatitis B should be immunized.  Haemophilus influenzae type b (Hib) vaccine. A previously unvaccinated person with asplenia or  sickle cell disease or having a scheduled splenectomy should receive 1 dose of Hib vaccine. Regardless of previous immunization, a recipient of a hematopoietic stem cell transplant should receive a 3-dose series 6-12 months after her successful transplant. Hib vaccine is not recommended for adults with HIV infection. Preventive Services / Frequency Ages 19 to 36 years  Blood pressure check.** / Every 3-5 years.  Lipid and cholesterol check.** / Every 5 years beginning at age 42.  Clinical breast exam.** / Every 3 years for women in their 40s and 15s.  BRCA-related cancer risk assessment.** / For women who have family members with a BRCA-related cancer (breast, ovarian, tubal, or peritoneal cancers).  Pap test.** / Every 2 years from ages 43 through 38. Every 3 years starting at age 12 through age 28 or 1 with a history of 3 consecutive normal Pap tests.  HPV screening.** / Every 3 years from ages 4 through ages 52 to 45 with a history of 3 consecutive normal Pap tests.  Hepatitis C blood test.** / For any individual with known risks for hepatitis C.  Skin self-exam. / Monthly.  Influenza vaccine. / Every year.  Tetanus, diphtheria, and acellular pertussis (Tdap, Td) vaccine.** / Consult your health care provider. Pregnant women should receive 1 dose of Tdap vaccine during each pregnancy. 1 dose of Td every 10 years.  Varicella vaccine.** / Consult your health care provider. Pregnant females who do not have evidence of immunity should receive the first dose after pregnancy.  HPV vaccine. / 3 doses over 6 months, if 39 and younger. The vaccine is not recommended for use in pregnant females. However, pregnancy testing is not needed before receiving a dose.  Measles, mumps, rubella (MMR) vaccine.** / You need at least 1 dose of MMR if you were born in 1957 or later. You may also need a 2nd dose. For females of  childbearing age, rubella immunity should be determined. If there is no evidence  of immunity, females who are not pregnant should be vaccinated. If there is no evidence of immunity, females who are pregnant should delay immunization until after pregnancy.  Pneumococcal 13-valent conjugate (PCV13) vaccine.** / Consult your health care provider.  Pneumococcal polysaccharide (PPSV23) vaccine.** / 1 to 2 doses if you smoke cigarettes or if you have certain conditions.  Meningococcal vaccine.** / 1 dose if you are age 80 to 43 years and a Market researcher living in a residence hall, or have one of several medical conditions, you need to get vaccinated against meningococcal disease. You may also need additional booster doses.  Hepatitis A vaccine.** / Consult your health care provider.  Hepatitis B vaccine.** / Consult your health care provider.  Haemophilus influenzae type b (Hib) vaccine.** / Consult your health care provider. Ages 63 to 55 years  Blood pressure check.** / Every year.  Lipid and cholesterol check.** / Every 5 years beginning at age 32 years.  Lung cancer screening. / Every year if you are aged 14-80 years and have a 30-pack-year history of smoking and currently smoke or have quit within the past 15 years. Yearly screening is stopped once you have quit smoking for at least 15 years or develop a health problem that would prevent you from having lung cancer treatment.  Clinical breast exam.** / Every year after age 18 years.  BRCA-related cancer risk assessment.** / For women who have family members with a BRCA-related cancer (breast, ovarian, tubal, or peritoneal cancers).  Mammogram.** / Every year beginning at age 40 years and continuing for as long as you are in good health. Consult with your health care provider.  Pap test.** / Every 3 years starting at age 75 years through age 4 or 58 years with a history of 3 consecutive normal Pap tests.  HPV screening.** / Every 3 years from ages 46 years through ages 22 to 72 years with a history of 3  consecutive normal Pap tests.  Fecal occult blood test (FOBT) of stool. / Every year beginning at age 4 years and continuing until age 15 years. You may not need to do this test if you get a colonoscopy every 10 years.  Flexible sigmoidoscopy or colonoscopy.** / Every 5 years for a flexible sigmoidoscopy or every 10 years for a colonoscopy beginning at age 63 years and continuing until age 71 years.  Hepatitis C blood test.** / For all people born from [birth year] through 1967 and any individual with known risks for hepatitis C.  Skin self-exam. / Monthly.  Influenza vaccine. / Every year.  Tetanus, diphtheria, and acellular pertussis (Tdap/Td) vaccine.** / Consult your health care provider. Pregnant women should receive 1 dose of Tdap vaccine during each pregnancy. 1 dose of Td every 10 years.  Varicella vaccine.** / Consult your health care provider. Pregnant females who do not have evidence of immunity should receive the first dose after pregnancy.  Zoster vaccine.** / 1 dose for adults aged 29 years or older.  Measles, mumps, rubella (MMR) vaccine.** / You need at least 1 dose of MMR if you were born in 1957 or later. You may also need a second dose. For females of childbearing age, rubella immunity should be determined. If there is no evidence of immunity, females who are not pregnant should be vaccinated. If there is no evidence of immunity, females who are pregnant should delay immunization until after pregnancy.  Pneumococcal 13-valent conjugate (  PCV13) vaccine.** / Consult your health care provider.  Pneumococcal polysaccharide (PPSV23) vaccine.** / 1 to 2 doses if you smoke cigarettes or if you have certain conditions.  Meningococcal vaccine.** / Consult your health care provider.  Hepatitis A vaccine.** / Consult your health care provider.  Hepatitis B vaccine.** / Consult your health care provider.  Haemophilus influenzae type b (Hib) vaccine.** / Consult your health care  provider. Ages 40 years and over  Blood pressure check.** / Every year.  Lipid and cholesterol check.** / Every 5 years beginning at age 36 years.  Lung cancer screening. / Every year if you are aged 45-80 years and have a 30-pack-year history of smoking and currently smoke or have quit within the past 15 years. Yearly screening is stopped once you have quit smoking for at least 15 years or develop a health problem that would prevent you from having lung cancer treatment.  Clinical breast exam.** / Every year after age 48 years.  BRCA-related cancer risk assessment.** / For women who have family members with a BRCA-related cancer (breast, ovarian, tubal, or peritoneal cancers).  Mammogram.** / Every year beginning at age 40 years and continuing for as long as you are in good health. Consult with your health care provider.  Pap test.** / Every 3 years starting at age 36 years through age 96 or 48 years with 3 consecutive normal Pap tests. Testing can be stopped between 65 and 70 years with 3 consecutive normal Pap tests and no abnormal Pap or HPV tests in the past 10 years.  HPV screening.** / Every 3 years from ages 76 years through ages 14 or 92 years with a history of 3 consecutive normal Pap tests. Testing can be stopped between 65 and 70 years with 3 consecutive normal Pap tests and no abnormal Pap or HPV tests in the past 10 years.  Fecal occult blood test (FOBT) of stool. / Every year beginning at age 9 years and continuing until age 69 years. You may not need to do this test if you get a colonoscopy every 10 years.  Flexible sigmoidoscopy or colonoscopy.** / Every 5 years for a flexible sigmoidoscopy or every 10 years for a colonoscopy beginning at age 78 years and continuing until age 44 years.  Hepatitis C blood test.** / For all people born from [birth year] through 1965 and any individual with known risks for hepatitis C.  Osteoporosis screening.** / A one-time screening for women ages  81 years and over and women at risk for fractures or osteoporosis.  Skin self-exam. / Monthly.  Influenza vaccine. / Every year.  Tetanus, diphtheria, and acellular pertussis (Tdap/Td) vaccine.** / 1 dose of Td every 10 years.  Varicella vaccine.** / Consult your health care provider.  Zoster vaccine.** / 1 dose for adults aged 79 years or older.  Pneumococcal 13-valent conjugate (PCV13) vaccine.** / Consult your health care provider.  Pneumococcal polysaccharide (PPSV23) vaccine.** / 1 dose for all adults aged 69 years and older.  Meningococcal vaccine.** / Consult your health care provider.  Hepatitis A vaccine.** / Consult your health care provider.  Hepatitis B vaccine.** / Consult your health care provider.  Haemophilus influenzae type b (Hib) vaccine.** / Consult your health care provider. ** Family history and personal history of risk and conditions may change your health care provider's recommendations.   This information is not intended to replace advice given to you by your health care provider. Make sure you discuss any questions you have with your health  care provider.   Document Released: 04/01/2001 Document Revised: 02/24/2014 Document Reviewed: 07/01/2010 Elsevier Interactive Patient Education 2016 [place] for Adults, Female A healthy lifestyle and preventive care can promote health and wellness. Preventive health guidelines for women include the following key practices.  A routine yearly physical is a good way to check with your health care provider about your health and preventive screening. It is a chance to share any concerns and updates on your health and to receive a thorough exam.  Visit your dentist for a routine exam and preventive care every 6 months. Brush your teeth twice a day and floss once a day. Good oral hygiene prevents tooth decay and gum disease.  The frequency of eye exams is based on your age, health, family medical  history, use of contact lenses, and other factors. Follow your health care provider's recommendations for frequency of eye exams.  Eat a healthy diet. Foods like vegetables, fruits, whole grains, low-fat dairy products, and lean protein foods contain the nutrients you need without too many calories. Decrease your intake of foods high in solid fats, added sugars, and salt. Eat the right amount of calories for you.Get information about a proper diet from your health care provider, if necessary.  Regular physical exercise is one of the most important things you can do for your health. Most adults should get at least 150 minutes of moderate-intensity exercise (any activity that increases your heart rate and causes you to sweat) each week. In addition, most adults need muscle-strengthening exercises on 2 or more days a week.  Maintain a healthy weight. The body mass index (BMI) is a screening tool to identify possible weight problems. It provides an estimate of body fat based on height and weight. Your health care provider can find your BMI and can help you achieve or maintain a healthy weight.For adults 20 years and older:  A BMI below 18.5 is considered underweight.  A BMI of 18.5 to 24.9 is normal.  A BMI of 25 to 29.9 is considered overweight.  A BMI of 30 and above is considered obese.  Maintain normal blood lipids and cholesterol levels by exercising and minimizing your intake of saturated fat. Eat a balanced diet with plenty of fruit and vegetables. Blood tests for lipids and cholesterol should begin at age 1 and be repeated every 5 years. If your lipid or cholesterol levels are high, you are over 50, or you are at high risk for heart disease, you may need your cholesterol levels checked more frequently.Ongoing high lipid and cholesterol levels should be treated with medicines if diet and exercise are not working.  If you smoke, find out from your health care provider how to quit. If you do  not use tobacco, do not start.  Lung cancer screening is recommended for adults aged 58-80 years who are at high risk for developing lung cancer because of a history of smoking. A yearly low-dose CT scan of the lungs is recommended for people who have at least a 30-pack-year history of smoking and are a current smoker or have quit within the past 15 years. A pack year of smoking is smoking an average of 1 pack of cigarettes a day for 1 year (for example: 1 pack a day for 30 years or 2 packs a day for 15 years). Yearly screening should continue until the smoker has stopped smoking for at least 15 years. Yearly screening should be stopped for people who develop a health problem  that would prevent them from having lung cancer treatment.  If you are pregnant, do not drink alcohol. If you are breastfeeding, be very cautious about drinking alcohol. If you are not pregnant and choose to drink alcohol, do not have more than 1 drink per day. One drink is considered to be 12 ounces (355 mL) of beer, 5 ounces (148 mL) of wine, or 1.5 ounces (44 mL) of liquor.  Avoid use of street drugs. Do not share needles with anyone. Ask for help if you need support or instructions about stopping the use of drugs.  High blood pressure causes heart disease and increases the risk of stroke. Your blood pressure should be checked at least every 1 to 2 years. Ongoing high blood pressure should be treated with medicines if weight loss and exercise do not work.  If you are 61-87 years old, ask your health care provider if you should take aspirin to prevent strokes.  Diabetes screening is done by taking a blood sample to check your blood glucose level after you have not eaten for a certain period of time (fasting). If you are not overweight and you do not have risk factors for diabetes, you should be screened once every 3 years starting at age 72. If you are overweight or obese and you are 36-19 years of age, you should be screened for  diabetes every year as part of your cardiovascular risk assessment.  Breast cancer screening is essential preventive care for women. You should practice "breast self-awareness." This means understanding the normal appearance and feel of your breasts and may include breast self-examination. Any changes detected, no matter how small, should be reported to a health care provider. Women in their 30s and 30s should have a clinical breast exam (CBE) by a health care provider as part of a regular health exam every 1 to 3 years. After age 88, women should have a CBE every year. Starting at age 49, women should consider having a mammogram (breast X-ray test) every year. Women who have a family history of breast cancer should talk to their health care provider about genetic screening. Women at a high risk of breast cancer should talk to their health care providers about having an MRI and a mammogram every year.  Breast cancer gene (BRCA)-related cancer risk assessment is recommended for women who have family members with BRCA-related cancers. BRCA-related cancers include breast, ovarian, tubal, and peritoneal cancers. Having family members with these cancers may be associated with an increased risk for harmful changes (mutations) in the breast cancer genes BRCA1 and BRCA2. Results of the assessment will determine the need for genetic counseling and BRCA1 and BRCA2 testing.  Your health care provider may recommend that you be screened regularly for cancer of the pelvic organs (ovaries, uterus, and vagina). This screening involves a pelvic examination, including checking for microscopic changes to the surface of your cervix (Pap test). You may be encouraged to have this screening done every 3 years, beginning at age 51.  For women ages 23-65, health care providers may recommend pelvic exams and Pap testing every 3 years, or they may recommend the Pap and pelvic exam, combined with testing for human papilloma virus  (HPV), every 5 years. Some types of HPV increase your risk of cervical cancer. Testing for HPV may also be done on women of any age with unclear Pap test results.  Other health care providers may not recommend any screening for nonpregnant women who are considered low risk for  pelvic cancer and who do not have symptoms. Ask your health care provider if a screening pelvic exam is right for you.  If you have had past treatment for cervical cancer or a condition that could lead to cancer, you need Pap tests and screening for cancer for at least 20 years after your treatment. If Pap tests have been discontinued, your risk factors (such as having a new sexual partner) need to be reassessed to determine if screening should resume. Some women have medical problems that increase the chance of getting cervical cancer. In these cases, your health care provider may recommend more frequent screening and Pap tests.  Colorectal cancer can be detected and often prevented. Most routine colorectal cancer screening begins at the age of 74 years and continues through age 41 years. However, your health care provider may recommend screening at an earlier age if you have risk factors for colon cancer. On a yearly basis, your health care provider may provide home test kits to check for hidden blood in the stool. Use of a small camera at the end of a tube, to directly examine the colon (sigmoidoscopy or colonoscopy), can detect the earliest forms of colorectal cancer. Talk to your health care provider about this at age 69, when routine screening begins. Direct exam of the colon should be repeated every 5-10 years through age 38 years, unless early forms of precancerous polyps or small growths are found.  People who are at an increased risk for hepatitis B should be screened for this virus. You are considered at high risk for hepatitis B if:  You were born in a country where hepatitis B occurs often. Talk with your health care  provider about which countries are considered high risk.  Your parents were born in a high-risk country and you have not received a shot to protect against hepatitis B (hepatitis B vaccine).  You have HIV or AIDS.  You use needles to inject street drugs.  You live with, or have sex with, someone who has hepatitis B.  You get hemodialysis treatment.  You take certain medicines for conditions like cancer, organ transplantation, and autoimmune conditions.  Hepatitis C blood testing is recommended for all people born from [birth year] through 1965 and any individual with known risks for hepatitis C.  Practice safe sex. Use condoms and avoid high-risk sexual practices to reduce the spread of sexually transmitted infections (STIs). STIs include gonorrhea, chlamydia, syphilis, trichomonas, herpes, HPV, and human immunodeficiency virus (HIV). Herpes, HIV, and HPV are viral illnesses that have no cure. They can result in disability, cancer, and death.  You should be screened for sexually transmitted illnesses (STIs) including gonorrhea and chlamydia if:  You are sexually active and are younger than 24 years.  You are older than 24 years and your health care provider tells you that you are at risk for this type of infection.  Your sexual activity has changed since you were last screened and you are at an increased risk for chlamydia or gonorrhea. Ask your health care provider if you are at risk.  If you are at risk of being infected with HIV, it is recommended that you take a prescription medicine daily to prevent HIV infection. This is called preexposure prophylaxis (PrEP). You are considered at risk if:  You are sexually active and do not regularly use condoms or know the HIV status of your partner(s).  You take drugs by injection.  You are sexually active with a partner who has HIV.  Talk with your health care provider about whether you are at high risk of being infected with HIV. If you choose to  begin PrEP, you should first be tested for HIV. You should then be tested every 3 months for as long as you are taking PrEP.  Osteoporosis is a disease in which the bones lose minerals and strength with aging. This can result in serious bone fractures or breaks. The risk of osteoporosis can be identified using a bone density scan. Women ages 22 years and over and women at risk for fractures or osteoporosis should discuss screening with their health care providers. Ask your health care provider whether you should take a calcium supplement or vitamin D to reduce the rate of osteoporosis.  Menopause can be associated with physical symptoms and risks. Hormone replacement therapy is available to decrease symptoms and risks. You should talk to your health care provider about whether hormone replacement therapy is right for you.  Use sunscreen. Apply sunscreen liberally and repeatedly throughout the day. You should seek shade when your shadow is shorter than you. Protect yourself by wearing long sleeves, pants, a wide-brimmed hat, and sunglasses year round, whenever you are outdoors.  Once a month, do a whole body skin exam, using a mirror to look at the skin on your back. Tell your health care provider of new moles, moles that have irregular borders, moles that are larger than a pencil eraser, or moles that have changed in shape or color.  Stay current with required vaccines (immunizations).  Influenza vaccine. All adults should be immunized every year.  Tetanus, diphtheria, and acellular pertussis (Td, Tdap) vaccine. Pregnant women should receive 1 dose of Tdap vaccine during each pregnancy. The dose should be obtained regardless of the length of time since the last dose. Immunization is preferred during the 27th-36th week of gestation. An adult who has not previously received Tdap or who does not know her vaccine status should receive 1 dose of Tdap. This initial dose should be followed by tetanus and  diphtheria toxoids (Td) booster doses every 10 years. Adults with an unknown or incomplete history of completing a 3-dose immunization series with Td-containing vaccines should begin or complete a primary immunization series including a Tdap dose. Adults should receive a Td booster every 10 years.  Varicella vaccine. An adult without evidence of immunity to varicella should receive 2 doses or a second dose if she has previously received 1 dose. Pregnant females who do not have evidence of immunity should receive the first dose after pregnancy. This first dose should be obtained before leaving the health care facility. The second dose should be obtained 4-8 weeks after the first dose.  Human papillomavirus (HPV) vaccine. Females aged 13-26 years who have not received the vaccine previously should obtain the 3-dose series. The vaccine is not recommended for use in pregnant females. However, pregnancy testing is not needed before receiving a dose. If a female is found to be pregnant after receiving a dose, no treatment is needed. In that case, the remaining doses should be delayed until after the pregnancy. Immunization is recommended for any person with an immunocompromised condition through the age of 76 years if she did not get any or all doses earlier. During the 3-dose series, the second dose should be obtained 4-8 weeks after the first dose. The third dose should be obtained 24 weeks after the first dose and 16 weeks after the second dose.  Zoster vaccine. One dose is recommended for adults  aged 35 years or older unless certain conditions are present.  Measles, mumps, and rubella (MMR) vaccine. Adults born before [birth year] generally are considered immune to measles and mumps. Adults born in [birth year] or later should have 1 or more doses of MMR vaccine unless there is a contraindication to the vaccine or there is laboratory evidence of immunity to each of the three diseases. A routine second dose of MMR vaccine  should be obtained at least 28 days after the first dose for students attending postsecondary schools, health care workers, or international travelers. People who received inactivated measles vaccine or an unknown type of measles vaccine during 1963-1967 should receive 2 doses of MMR vaccine. People who received inactivated mumps vaccine or an unknown type of mumps vaccine before 1979 and are at high risk for mumps infection should consider immunization with 2 doses of MMR vaccine. For females of childbearing age, rubella immunity should be determined. If there is no evidence of immunity, females who are not pregnant should be vaccinated. If there is no evidence of immunity, females who are pregnant should delay immunization until after pregnancy. Unvaccinated health care workers born before [birth year] who lack laboratory evidence of measles, mumps, or rubella immunity or laboratory confirmation of disease should consider measles and mumps immunization with 2 doses of MMR vaccine or rubella immunization with 1 dose of MMR vaccine.  Pneumococcal 13-valent conjugate (PCV13) vaccine. When indicated, a person who is uncertain of his immunization history and has no record of immunization should receive the PCV13 vaccine. All adults 61 years of age and older should receive this vaccine. An adult aged 42 years or older who has certain medical conditions and has not been previously immunized should receive 1 dose of PCV13 vaccine. This PCV13 should be followed with a dose of pneumococcal polysaccharide (PPSV23) vaccine. Adults who are at high risk for pneumococcal disease should obtain the PPSV23 vaccine at least 8 weeks after the dose of PCV13 vaccine. Adults older than 52 years of age who have normal immune system function should obtain the PPSV23 vaccine dose at least 1 year after the dose of PCV13 vaccine.  Pneumococcal polysaccharide (PPSV23) vaccine. When PCV13 is also indicated, PCV13 should be obtained first. All  adults aged 79 years and older should be immunized. An adult younger than age 19 years who has certain medical conditions should be immunized. Any person who resides in a nursing home or long-term care facility should be immunized. An adult smoker should be immunized. People with an immunocompromised condition and certain other conditions should receive both PCV13 and PPSV23 vaccines. People with human immunodeficiency virus (HIV) infection should be immunized as soon as possible after diagnosis. Immunization during chemotherapy or radiation therapy should be avoided. Routine use of PPSV23 vaccine is not recommended for American Indians, [place] Natives, or people younger than 65 years unless there are medical conditions that require PPSV23 vaccine. When indicated, people who have unknown immunization and have no record of immunization should receive PPSV23 vaccine. One-time revaccination 5 years after the first dose of PPSV23 is recommended for people aged 19-64 years who have chronic kidney failure, nephrotic syndrome, asplenia, or immunocompromised conditions. People who received 1-2 doses of PPSV23 before age 65 years should receive another dose of PPSV23 vaccine at age 33 years or later if at least 5 years have passed since the previous dose. Doses of PPSV23 are not needed for people immunized with PPSV23 at or after age 101 years.  Meningococcal vaccine. Adults with asplenia or  persistent complement component deficiencies should receive 2 doses of quadrivalent meningococcal conjugate (MenACWY-D) vaccine. The doses should be obtained at least 2 months apart. Microbiologists working with certain meningococcal bacteria, [place] recruits, people at risk during an outbreak, and people who travel to or live in countries with a high rate of meningitis should be immunized. A first-year college student up through age 6 years who is living in a residence hall should receive a dose if she did not receive a dose on or  after her 16th birthday. Adults who have certain high-risk conditions should receive one or more doses of vaccine.  Hepatitis A vaccine. Adults who wish to be protected from this disease, have certain high-risk conditions, work with hepatitis A-infected animals, work in hepatitis A research labs, or travel to or work in countries with a high rate of hepatitis A should be immunized. Adults who were previously unvaccinated and who anticipate close contact with an international adoptee during the first 60 days after arrival in the [place] from a country with a high rate of hepatitis A should be immunized.  Hepatitis B vaccine. Adults who wish to be protected from this disease, have certain high-risk conditions, may be exposed to blood or other infectious body fluids, are household contacts or sex partners of hepatitis B positive people, are clients or workers in certain care facilities, or travel to or work in countries with a high rate of hepatitis B should be immunized.  Haemophilus influenzae type b (Hib) vaccine. A previously unvaccinated person with asplenia or sickle cell disease or having a scheduled splenectomy should receive 1 dose of Hib vaccine. Regardless of previous immunization, a recipient of a hematopoietic stem cell transplant should receive a 3-dose series 6-12 months after her successful transplant. Hib vaccine is not recommended for adults with HIV infection. Preventive Services / Frequency Ages 50 to 23 years  Blood pressure check.** / Every 3-5 years.  Lipid and cholesterol check.** / Every 5 years beginning at age 29.  Clinical breast exam.** / Every 3 years for women in their 63s and 49s.  BRCA-related cancer risk assessment.** / For women who have family members with a BRCA-related cancer (breast, ovarian, tubal, or peritoneal cancers).  Pap test.** / Every 2 years from ages 33 through 64. Every 3 years starting at age 42 through age 63 or 84 with a history of 3  consecutive normal Pap tests.  HPV screening.** / Every 3 years from ages 26 through ages 66 to 32 with a history of 3 consecutive normal Pap tests.  Hepatitis C blood test.** / For any individual with known risks for hepatitis C.  Skin self-exam. / Monthly.  Influenza vaccine. / Every year.  Tetanus, diphtheria, and acellular pertussis (Tdap, Td) vaccine.** / Consult your health care provider. Pregnant women should receive 1 dose of Tdap vaccine during each pregnancy. 1 dose of Td every 10 years.  Varicella vaccine.** / Consult your health care provider. Pregnant females who do not have evidence of immunity should receive the first dose after pregnancy.  HPV vaccine. / 3 doses over 6 months, if 75 and younger. The vaccine is not recommended for use in pregnant females. However, pregnancy testing is not needed before receiving a dose.  Measles, mumps, rubella (MMR) vaccine.** / You need at least 1 dose of MMR if you were born in 1957 or later. You may also need a 2nd dose. For females of childbearing age, rubella immunity should be determined. If there is no evidence  of immunity, females who are not pregnant should be vaccinated. If there is no evidence of immunity, females who are pregnant should delay immunization until after pregnancy.  Pneumococcal 13-valent conjugate (PCV13) vaccine.** / Consult your health care provider.  Pneumococcal polysaccharide (PPSV23) vaccine.** / 1 to 2 doses if you smoke cigarettes or if you have certain conditions.  Meningococcal vaccine.** / 1 dose if you are age 70 to 16 years and a Market researcher living in a residence hall, or have one of several medical conditions, you need to get vaccinated against meningococcal disease. You may also need additional booster doses.  Hepatitis A vaccine.** / Consult your health care provider.  Hepatitis B vaccine.** / Consult your health care provider.  Haemophilus influenzae type b (Hib) vaccine.** /  Consult your health care provider. Ages 20 to 57 years  Blood pressure check.** / Every year.  Lipid and cholesterol check.** / Every 5 years beginning at age 56 years.  Lung cancer screening. / Every year if you are aged 25-80 years and have a 30-pack-year history of smoking and currently smoke or have quit within the past 15 years. Yearly screening is stopped once you have quit smoking for at least 15 years or develop a health problem that would prevent you from having lung cancer treatment.  Clinical breast exam.** / Every year after age 80 years.  BRCA-related cancer risk assessment.** / For women who have family members with a BRCA-related cancer (breast, ovarian, tubal, or peritoneal cancers).  Mammogram.** / Every year beginning at age 41 years and continuing for as long as you are in good health. Consult with your health care provider.  Pap test.** / Every 3 years starting at age 1 years through age 21 or 66 years with a history of 3 consecutive normal Pap tests.  HPV screening.** / Every 3 years from ages 37 years through ages 41 to 26 years with a history of 3 consecutive normal Pap tests.  Fecal occult blood test (FOBT) of stool. / Every year beginning at age 78 years and continuing until age 7 years. You may not need to do this test if you get a colonoscopy every 10 years.  Flexible sigmoidoscopy or colonoscopy.** / Every 5 years for a flexible sigmoidoscopy or every 10 years for a colonoscopy beginning at age 13 years and continuing until age 33 years.  Hepatitis C blood test.** / For all people born from [birth year] through 1967 and any individual with known risks for hepatitis C.  Skin self-exam. / Monthly.  Influenza vaccine. / Every year.  Tetanus, diphtheria, and acellular pertussis (Tdap/Td) vaccine.** / Consult your health care provider. Pregnant women should receive 1 dose of Tdap vaccine during each pregnancy. 1 dose of Td every 10 years.  Varicella vaccine.** /  Consult your health care provider. Pregnant females who do not have evidence of immunity should receive the first dose after pregnancy.  Zoster vaccine.** / 1 dose for adults aged 89 years or older.  Measles, mumps, rubella (MMR) vaccine.** / You need at least 1 dose of MMR if you were born in 1957 or later. You may also need a second dose. For females of childbearing age, rubella immunity should be determined. If there is no evidence of immunity, females who are not pregnant should be vaccinated. If there is no evidence of immunity, females who are pregnant should delay immunization until after pregnancy.  Pneumococcal 13-valent conjugate (PCV13) vaccine.** / Consult your health care provider.  Pneumococcal polysaccharide (PPSV23)  vaccine.** / 1 to 2 doses if you smoke cigarettes or if you have certain conditions.  Meningococcal vaccine.** / Consult your health care provider.  Hepatitis A vaccine.** / Consult your health care provider.  Hepatitis B vaccine.** / Consult your health care provider.  Haemophilus influenzae type b (Hib) vaccine.** / Consult your health care provider. Ages 28 years and over  Blood pressure check.** / Every year.  Lipid and cholesterol check.** / Every 5 years beginning at age 35 years.  Lung cancer screening. / Every year if you are aged 86-80 years and have a 30-pack-year history of smoking and currently smoke or have quit within the past 15 years. Yearly screening is stopped once you have quit smoking for at least 15 years or develop a health problem that would prevent you from having lung cancer treatment.  Clinical breast exam.** / Every year after age 75 years.  BRCA-related cancer risk assessment.** / For women who have family members with a BRCA-related cancer (breast, ovarian, tubal, or peritoneal cancers).  Mammogram.** / Every year beginning at age 81 years and continuing for as long as you are in good health. Consult with your health care  provider.  Pap test.** / Every 3 years starting at age 69 years through age 64 or 46 years with 3 consecutive normal Pap tests. Testing can be stopped between 65 and 70 years with 3 consecutive normal Pap tests and no abnormal Pap or HPV tests in the past 10 years.  HPV screening.** / Every 3 years from ages 38 years through ages 51 or 97 years with a history of 3 consecutive normal Pap tests. Testing can be stopped between 65 and 70 years with 3 consecutive normal Pap tests and no abnormal Pap or HPV tests in the past 10 years.  Fecal occult blood test (FOBT) of stool. / Every year beginning at age 63 years and continuing until age 55 years. You may not need to do this test if you get a colonoscopy every 10 years.  Flexible sigmoidoscopy or colonoscopy.** / Every 5 years for a flexible sigmoidoscopy or every 10 years for a colonoscopy beginning at age 77 years and continuing until age 40 years.  Hepatitis C blood test.** / For all people born from [birth year] through 1965 and any individual with known risks for hepatitis C.  Osteoporosis screening.** / A one-time screening for women ages 81 years and over and women at risk for fractures or osteoporosis.  Skin self-exam. / Monthly.  Influenza vaccine. / Every year.  Tetanus, diphtheria, and acellular pertussis (Tdap/Td) vaccine.** / 1 dose of Td every 10 years.  Varicella vaccine.** / Consult your health care provider.  Zoster vaccine.** / 1 dose for adults aged 27 years or older.  Pneumococcal 13-valent conjugate (PCV13) vaccine.** / Consult your health care provider.  Pneumococcal polysaccharide (PPSV23) vaccine.** / 1 dose for all adults aged 24 years and older.  Meningococcal vaccine.** / Consult your health care provider.  Hepatitis A vaccine.** / Consult your health care provider.  Hepatitis B vaccine.** / Consult your health care provider.  Haemophilus influenzae type b (Hib) vaccine.** / Consult your health care provider. **  Family history and personal history of risk and conditions may change your health care provider's recommendations.   This information is not intended to replace advice given to you by your health care provider. Make sure you discuss any questions you have with your health care provider.   Document Released: 04/01/2001 Document Revised: 02/24/2014 Document Reviewed:  07/01/2010 Elsevier Interactive Patient Education [employer].

## 2015-10-14 NOTE — Progress Notes (Signed)
Patient ID: Desta Bujak, female   DOB: September 22, 1965, 50 y.o.   MRN: [id number]   Subjective:    Patient ID: Dalene Carrow, female    DOB: [birth year], 50 y.o.   MRN: [id number]  Chief Complaint  Patient presents with  . Annual Exam    HPI Patient is in today for annual preventative exam and follow up. Has been struggling with sore throat and fatigue for several days as well as some mild nasal congestion. Denies rhinorrhea, fevers, chills or other acute complaints. Has otherwise been feeling well. Follows with endocrinology and notes hgba1c is under 6.no polyuria or polydipsia. Denies CP/palp/SOB/HA/congestion/fevers/GI or GU c/o. Taking meds as prescribed  Past Medical History:  Diagnosis Date  . Abdominal pain 08/06/2014  . Acute bronchitis 12/15/2010  . Allergy    seasonal  . Diabetes mellitus without complication (HCC)    diet controlled  . Diabetes type 2, controlled (HCC) 08/06/2014   Follows with Dr Talmage Nap  . Hyperlipidemia   . Hypothyroidism 08/06/2014  . Osteoporosis   . Preventative health care 08/06/2014    Past Surgical History:  Procedure Laterality Date  . NO PAST SURGERIES      Family History  Problem Relation Age of Onset  . Diabetes Father     DM II  . Hypertension Father   . Melanoma Father   . Dementia Father   . Memory loss Father   . Hypertension Mother   . Fibroids Mother     uterine  . Hyperlipidemia Mother   . Stroke Mother   . Cancer Maternal Grandmother     colon  . Coronary artery disease Maternal Grandmother   . Heart attack Maternal Grandmother   . Colon cancer Maternal Grandmother 75  . Heart disease Maternal Grandfather     MI at roughly 36  . Stroke Paternal Grandmother   . Dementia Paternal Grandmother   . Cancer Paternal Grandfather     Lung  . Emphysema Paternal Grandfather   . Ovarian cysts Sister   . Heart disease Maternal Aunt 72    MI  . Heart disease Maternal Uncle 67    ish  . Heart disease Maternal Uncle 59    ish,  MI  . Allergies Son     Social History   Social History  . Marital status: Married    Spouse name: N/A  . Number of children: N/A  . Years of education: N/A   Occupational History  . Not on file.   Social History Main Topics  . Smoking status: Never Smoker  . Smokeless tobacco: Never Used  . Alcohol use 0.0 oz/week  . Drug use: No  . 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 Not on file   Social History Narrative  . No narrative on file    Outpatient Medications Prior to Visit  Medication Sig Dispense Refill  . estradiol (VIVELLE-DOT) 0.05 MG/24HR patch Place 1 patch onto the skin 2 (two) times a week.    . levocetirizine (XYZAL) 5 MG tablet Take 5 mg by mouth every evening.    . ONE TOUCH ULTRA TEST test strip 1 strip.  4  . progesterone (PROMETRIUM) 100 MG capsule Take 100 mg by mouth daily.    . Progesterone Micronized (PROGESTERONE PO) Take by mouth.    . risedronate (ACTONEL) 35 MG tablet Take 35  mg by mouth once a week. osteoporosis  8  . levothyroxine (SYNTHROID, LEVOTHROID) 88 MCG tablet Take 88 mcg by mouth daily before breakfast.     No facility-administered medications prior to visit.     Allergies  Allergen Reactions  . Codeine   . Penicillins     Review of Systems  Constitutional: Negative for chills, fever and malaise/fatigue.  HENT: Negative for congestion and hearing loss.   Eyes: Negative for discharge.  Respiratory: Negative for cough, sputum production and shortness of breath.   Cardiovascular: Negative for chest pain, palpitations and leg swelling.  Gastrointestinal: Negative for abdominal pain, blood in stool, constipation, diarrhea, heartburn, nausea and vomiting.  Genitourinary: Negative for dysuria, frequency, hematuria and urgency.  Musculoskeletal: Negative for back pain, falls and myalgias.  Skin:  Negative for rash.  Neurological: Negative for dizziness, sensory change, loss of consciousness, weakness and headaches.  Endo/Heme/Allergies: Negative for environmental allergies. Does not bruise/bleed easily.  Psychiatric/Behavioral: Negative for depression and suicidal ideas. The patient is not nervous/anxious and does not have insomnia.        Objective:    Physical Exam  BP 102/72 (BP Location: Left Arm, Patient Position: Sitting, Cuff Size: Normal)   Pulse 73   Temp 98.2 F (36.8 C) (Oral)   Ht 5\' 1"  (1.549 m)   Wt 100 lb 6 oz (45.5 kg)   LMP 01/14/2011   SpO2 98%   BMI 18.97 kg/m  Wt Readings from Last 3 Encounters:  10/04/15 100 lb 6 oz (45.5 kg)  10/13/14 104 lb 11.2 oz (47.5 kg)  07/25/14 104 lb 4 oz (47.3 kg)     Lab Results  Component Value Date   WBC 5.8 07/25/2014   HGB 13.5 07/25/2014   HCT 39.4 07/25/2014   PLT 245.0 07/25/2014   GLUCOSE 87 07/25/2014   ALT 12 07/25/2014   AST 18 07/25/2014   NA 136 07/25/2014   K 3.9 07/25/2014   CL 101 07/25/2014   CREATININE 0.75 07/25/2014   BUN 15 07/25/2014   CO2 29 07/25/2014   HGBA1C 5.5 05/02/2014    No results found for: TSH Lab Results  Component Value Date   WBC 5.8 07/25/2014   HGB 13.5 07/25/2014   HCT 39.4 07/25/2014   MCV 88.0 07/25/2014   PLT 245.0 07/25/2014   Lab Results  Component Value Date   NA 136 07/25/2014   K 3.9 07/25/2014   CO2 29 07/25/2014   GLUCOSE 87 07/25/2014   BUN 15 07/25/2014   CREATININE 0.75 07/25/2014   BILITOT 0.5 07/25/2014   ALKPHOS 56 07/25/2014   AST 18 07/25/2014   ALT 12 07/25/2014   PROT 7.1 07/25/2014   ALBUMIN 4.5 07/25/2014   CALCIUM 9.3 07/25/2014   GFR 86.46 07/25/2014   No results found for: CHOL No results found for: HDL No results found for: LDLCALC No results found for: TRIG No results found for: CHOLHDL Lab Results  Component Value Date   HGBA1C 5.5 05/02/2014       Assessment & Plan:   Problem List Items Addressed This Visit     Disorder of bone and cartilage    Encouraged to get adequate exercise, calcium and vitamin d intake      Diabetes type 2, controlled (HCC)    hgba1c acceptable, minimize simple carbs. Increase exercise as tolerated. Continue current meds. Follows with Dr Talmage Nap. Start ECASA 81 mg daily      Preventative health care    Patient encouraged  to maintain heart healthy diet, regular exercise, adequate sleep. Consider daily probiotics. Take medications as prescribed. Given and reviewed [place] of [place] and encouraged to complete and return       Other Visit Diagnoses    Streptococcal sore throat    -  Primary   Relevant Orders   POCT rapid strep A (Completed)      I am having Ms. Yasin maintain her levocetirizine, estradiol, Progesterone Micronized (PROGESTERONE PO), risedronate, ONE TOUCH ULTRA TEST, progesterone, levothyroxine, and Vitamin D (Ergocalciferol).  Meds ordered this encounter  Medications  . levothyroxine (SYNTHROID, LEVOTHROID) 75 MCG tablet    Sig: Take 75 mcg by mouth daily before breakfast.  . Vitamin D, Ergocalciferol, (DRISDOL) 50000 units CAPS capsule    Sig: Take 50,000 Units by mouth every 7 (seven) days.     Danise EdgeBLYTH, Davelle Anselmi, MD

## 2015-10-14 NOTE — Assessment & Plan Note (Signed)
Patient encouraged to maintain heart healthy diet, regular exercise, adequate sleep. Consider daily probiotics. Take medications as prescribed. Given and reviewed copy of ACP documents from [place] Secretary of State and encouraged to complete and return 

## 2016-02-25 ENCOUNTER — Encounter: Payer: Self-pay | Admitting: Family Medicine

## 2016-02-25 ENCOUNTER — Ambulatory Visit (INDEPENDENT_AMBULATORY_CARE_PROVIDER_SITE_OTHER): Payer: Managed Care, Other (non HMO) | Admitting: Family Medicine

## 2016-02-25 VITALS — BP 106/58 | HR 74 | Temp 97.6°F | Ht 62.0 in | Wt 106.6 lb

## 2016-02-25 DIAGNOSIS — J0100 Acute maxillary sinusitis, unspecified: Secondary | ICD-10-CM

## 2016-02-25 DIAGNOSIS — H6983 Other specified disorders of Eustachian tube, bilateral: Secondary | ICD-10-CM | POA: Diagnosis not present

## 2016-02-25 MED ORDER — DOXYCYCLINE HYCLATE 100 MG PO TABS: 100.0000 mg | ORAL_TABLET | Freq: Two times a day (BID) | ORAL | 0 refills | Status: DC

## 2016-02-25 NOTE — Progress Notes (Signed)
Chief Complaint  Patient presents with  . Ear Pain    Pt states she has ear pain with dizziness and congestion and pressure    Kelli Barry is 51 y.o. female here for possible sinus infection.  Duration: 3 weeks Symptoms include: nasal congestion, purulent rhinorrhea, facial pain, ear pain, some muffled hearing, intermittent dizziness Denies: cough, fevers, sore throats, itchy eyes, ear drainage Treatment to date: Mucinex  Sick contacts? No  ROS:  Const: Denies fevers HEENT: As noted in HPI  Past Medical History:  Diagnosis Date  . Abdominal pain 08/06/2014  . Acute bronchitis 12/15/2010  . Allergy    seasonal  . Diabetes mellitus without complication (HCC)    diet controlled  . Diabetes type 2, controlled (HCC) 08/06/2014   Follows with Dr Talmage NapBalan  . Hyperlipidemia   . Hypothyroidism 08/06/2014  . Osteoporosis   . Preventative health care 08/06/2014   Social History   Social History  . Marital status: Married   Social History Main Topics  . Smoking status: Never Smoker  . Smokeless tobacco: Never Used  . Alcohol use 0.0 oz/week  . Drug use: No  . 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 Diabetes Father     DM II  . Hypertension Father   . Melanoma Father   . Dementia Father   . Memory loss Father   . Hypertension Mother   . Fibroids Mother     uterine  . Hyperlipidemia Mother   . Stroke Mother   . Cancer Maternal Grandmother     colon  . Coronary artery disease Maternal Grandmother   . Heart attack Maternal Grandmother   . Colon cancer Maternal Grandmother 75  . Heart disease Maternal Grandfather     MI at roughly 6750  . Stroke Paternal Grandmother   . Dementia Paternal Grandmother   . Cancer Paternal Grandfather     Lung  . Emphysema Paternal Grandfather   . Ovarian cysts Sister   . Heart  disease Maternal Aunt 72    MI  . Heart disease Maternal Uncle [id number]    ish  . Heart disease Maternal Uncle [id number]    ish, MI  . Allergies Son     BP (!) 106/58 (BP Location: Right Arm, Patient Position: Sitting, Cuff Size: Small)   Pulse 74   Temp 97.6 F (36.4 C) (Oral)   Ht 5\' 2"  (1.575 m)   Wt 106 lb 9.6 oz (48.4 kg)   LMP 01/14/2011   SpO2 98%   BMI 19.50 kg/m  Gen- Awake, alert, appears stated age HEENT- Ears patent, canals unremarkable, TM's retracted b/l, nares are patent, max sinuses are mildly TTP, frontal sinsuses not tender, MMM, pharynx is not erythematous or with exudate Heart- RRR, no murmur, no bruits Lungs- CTAB, no accessory muscle use Psych- Age appropriate judgment and insight, nml mood and affect  Acute non-recurrent maxillary sinusitis - Plan: doxycycline (VIBRA-TABS) 100 MG tablet  Dysfunction of both eustachian tubes  Orders as above. Will tx given duration of illness. OTC remedies such as PO antihistamines and INCS spray recommended and listed in AVS. Continue to practice good hand hygiene, push fluids, and cover mouth when/if coughing. F/u prn. The patient voiced understanding and agreement to the plan.  Jilda Rocheicholas Paul [place], DO 02/25/16 10:39 AM

## 2016-02-25 NOTE — Progress Notes (Signed)
Pre visit review using our clinic review tool, if applicable. No additional management support is needed unless otherwise documented below in the visit note. 

## 2016-02-25 NOTE — Patient Instructions (Signed)
Claritin (loratadine), Allegra (fexofenadine), Zyrtec (cetirizine); these are listed in order from weakest to strongest. Generic, and therefore cheaper, options are in the parentheses.   Flonase (fluticasone); nasal spray that is over the counter. 2 sprays each nostril, once daily. Aim towards the same side eye when you spray.  There are available OTC, and the generic versions, which may be cheaper, are in parentheses. Show this to a pharmacist if you have trouble finding any of these items.  

## 2016-10-16 ENCOUNTER — Ambulatory Visit (INDEPENDENT_AMBULATORY_CARE_PROVIDER_SITE_OTHER): Payer: 59 | Admitting: Family Medicine

## 2016-10-16 ENCOUNTER — Encounter: Payer: Self-pay | Admitting: Family Medicine

## 2016-10-16 VITALS — BP 93/64 | HR 84 | Temp 98.4°F | Ht 61.0 in | Wt 104.8 lb

## 2016-10-16 DIAGNOSIS — R21 Rash and other nonspecific skin eruption: Secondary | ICD-10-CM | POA: Diagnosis not present

## 2016-10-16 DIAGNOSIS — E119 Type 2 diabetes mellitus without complications: Secondary | ICD-10-CM

## 2016-10-16 DIAGNOSIS — E782 Mixed hyperlipidemia: Secondary | ICD-10-CM

## 2016-10-16 DIAGNOSIS — Z0000 Encounter for general adult medical examination without abnormal findings: Secondary | ICD-10-CM

## 2016-10-16 DIAGNOSIS — E559 Vitamin D deficiency, unspecified: Secondary | ICD-10-CM

## 2016-10-16 DIAGNOSIS — Z23 Encounter for immunization: Secondary | ICD-10-CM | POA: Diagnosis not present

## 2016-10-16 DIAGNOSIS — Z0001 Encounter for general adult medical examination with abnormal findings: Secondary | ICD-10-CM

## 2016-10-16 DIAGNOSIS — F419 Anxiety disorder, unspecified: Secondary | ICD-10-CM

## 2016-10-16 DIAGNOSIS — Z780 Asymptomatic menopausal state: Secondary | ICD-10-CM

## 2016-10-16 DIAGNOSIS — E039 Hypothyroidism, unspecified: Secondary | ICD-10-CM

## 2016-10-16 HISTORY — DX: Anxiety disorder, unspecified: F41.9

## 2016-10-16 HISTORY — DX: Rash and other nonspecific skin eruption: R21

## 2016-10-16 LAB — COMPREHENSIVE METABOLIC PANEL
ALT: 14 U/L (ref 0–35)
AST: 20 U/L (ref 0–37)
Albumin: 4.4 g/dL (ref 3.5–5.2)
Alkaline Phosphatase: 62 U/L (ref 39–117)
BUN: 20 mg/dL (ref 6–23)
CO2: 27 mEq/L (ref 19–32)
Calcium: 9.9 mg/dL (ref 8.4–10.5)
Chloride: 104 mEq/L (ref 96–112)
Creatinine, Ser: 0.72 mg/dL (ref 0.40–1.20)
GFR: 89.86 mL/min (ref >60.00)
Glucose, Bld: 80 mg/dL (ref 70–99)
Potassium: 4.4 mEq/L (ref 3.5–5.1)
Sodium: 139 mEq/L (ref 135–145)
Total Bilirubin: 0.4 mg/dL (ref 0.2–1.2)
Total Protein: 7.2 g/dL (ref 6.0–8.3)

## 2016-10-16 LAB — CBC
HCT: 42.7 % (ref 36.0–46.0)
Hemoglobin: 14.1 g/dL (ref 12.0–15.0)
MCHC: 33 g/dL (ref 30.0–36.0)
MCV: 90.7 fl (ref 78.0–100.0)
Platelets: 261 10*3/uL (ref 150.0–400.0)
RBC: 4.7 Mil/uL (ref 3.87–5.11)
RDW: 13.4 % (ref 11.5–15.5)
WBC: 6.3 10*3/uL (ref 4.0–10.5)

## 2016-10-16 LAB — LIPID PANEL
Cholesterol: 218 mg/dL — ABNORMAL HIGH (ref 0–200)
HDL: 76.4 mg/dL (ref >39.00)
LDL Cholesterol: 121 mg/dL — ABNORMAL HIGH (ref 0–99)
NonHDL: 141.62
Total CHOL/HDL Ratio: 3
Triglycerides: 103 mg/dL (ref 0.0–149.0)
VLDL: 20.6 mg/dL (ref 0.0–40.0)

## 2016-10-16 LAB — VITAMIN D 25 HYDROXY (VIT D DEFICIENCY, FRACTURES): VITD: 39.63 ng/mL (ref 30.00–100.00)

## 2016-10-16 LAB — HEMOGLOBIN A1C: Hgb A1c MFr Bld: 5.8 % (ref 4.6–6.5)

## 2016-10-16 MED ORDER — ALPRAZOLAM 0.25 MG PO TABS: 0.2500 mg | ORAL_TABLET | Freq: Two times a day (BID) | ORAL | 0 refills | Status: DC | PRN

## 2016-10-16 MED ORDER — TETANUS-DIPHTH-ACELL PERTUSSIS 5-2.5-18.5 LF-MCG/0.5 IM SUSP: 0.5000 mL | Freq: Once | INTRAMUSCULAR | Status: DC

## 2016-10-16 NOTE — Assessment & Plan Note (Signed)
Encouraged heart healthy diet, increase exercise, avoid trans fats, consider a krill oil cap daily 

## 2016-10-16 NOTE — Assessment & Plan Note (Signed)
Follows with GYN Dr Guilford ShiLovoie and they manage her mgms, dexa scans and paps

## 2016-10-16 NOTE — Patient Instructions (Signed)
Preventive Care 40-64 Years, Female Preventive care refers to lifestyle choices and visits with your health care provider that can promote health and wellness. What does preventive care include?  A yearly physical exam. This is also called an annual well check.  Dental exams once or twice a year.  Routine eye exams. Ask your health care provider how often you should have your eyes checked.  Personal lifestyle choices, including: ? Daily care of your teeth and gums. ? Regular physical activity. ? Eating a healthy diet. ? Avoiding tobacco and drug use. ? Limiting alcohol use. ? Practicing safe sex. ? Taking low-dose aspirin daily starting at age 58. ? Taking vitamin and mineral supplements as recommended by your health care provider. What happens during an annual well check? The services and screenings done by your health care provider during your annual well check will depend on your age, overall health, lifestyle risk factors, and family history of disease. Counseling Your health care provider may ask you questions about your:  Alcohol use.  Tobacco use.  Drug use.  Emotional well-being.  Home and relationship well-being.  Sexual activity.  Eating habits.  Work and work Statistician.  Method of birth control.  Menstrual cycle.  Pregnancy history.  Screening You may have the following tests or measurements:  Height, weight, and BMI.  Blood pressure.  Lipid and cholesterol levels. These may be checked every 5 years, or more frequently if you are over 81 years old.  Skin check.  Lung cancer screening. You may have this screening every year starting at age 78 if you have a 30-pack-year history of smoking and currently smoke or have quit within the past 15 years.  Fecal occult blood test (FOBT) of the stool. You may have this test every year starting at age 65.  Flexible sigmoidoscopy or colonoscopy. You may have a sigmoidoscopy every 5 years or a colonoscopy  every 10 years starting at age 30.  Hepatitis C blood test.  Hepatitis B blood test.  Sexually transmitted disease (STD) testing.  Diabetes screening. This is done by checking your blood sugar (glucose) after you have not eaten for a while (fasting). You may have this done every 1-3 years.  Mammogram. This may be done every 1-2 years. Talk to your health care provider about when you should start having regular mammograms. This may depend on whether you have a family history of breast cancer.  BRCA-related cancer screening. This may be done if you have a family history of breast, ovarian, tubal, or peritoneal cancers.  Pelvic exam and Pap test. This may be done every 3 years starting at age 80. Starting at age 36, this may be done every 5 years if you have a Pap test in combination with an HPV test.  Bone density scan. This is done to screen for osteoporosis. You may have this scan if you are at high risk for osteoporosis.  Discuss your test results, treatment options, and if necessary, the need for more tests with your health care provider. Vaccines Your health care provider may recommend certain vaccines, such as:  Influenza vaccine. This is recommended every year.  Tetanus, diphtheria, and acellular pertussis (Tdap, Td) vaccine. You may need a Td booster every 10 years.  Varicella vaccine. You may need this if you have not been vaccinated.  Zoster vaccine. You may need this after age 5.  Measles, mumps, and rubella (MMR) vaccine. You may need at least one dose of MMR if you were born in  1957 or later. You may also need a second dose.  Pneumococcal 13-valent conjugate (PCV13) vaccine. You may need this if you have certain conditions and were not previously vaccinated.  Pneumococcal polysaccharide (PPSV23) vaccine. You may need one or two doses if you smoke cigarettes or if you have certain conditions.  Meningococcal vaccine. You may need this if you have certain  conditions.  Hepatitis A vaccine. You may need this if you have certain conditions or if you travel or work in places where you may be exposed to hepatitis A.  Hepatitis B vaccine. You may need this if you have certain conditions or if you travel or work in places where you may be exposed to hepatitis B.  Haemophilus influenzae type b (Hib) vaccine. You may need this if you have certain conditions.  Talk to your health care provider about which screenings and vaccines you need and how often you need them. This information is not intended to replace advice given to you by your health care provider. Make sure you discuss any questions you have with your health care provider. Document Released: 03/02/2015 Document Revised: 10/24/2015 Document Reviewed: 12/05/2014 Elsevier Interactive Patient Education  2017 [employer].

## 2016-10-16 NOTE — Assessment & Plan Note (Signed)
On Levothyroxine, continue to monitor 

## 2016-10-16 NOTE — Assessment & Plan Note (Addendum)
On feet, not pruritic and has been seen by dermatology and those prescriptions were not helpful. Unclear etiology.

## 2016-10-16 NOTE — Assessment & Plan Note (Signed)
Saw her Endocrinologist in March and was told her D was low. Recheck vitamin D today

## 2016-10-16 NOTE — Assessment & Plan Note (Addendum)
hgba1c acceptable, minimize simple carbs. Increase exercise as tolerated. Dr Talmage NapBalan follows. She has had another family member diagnosed with CAD and she is interested in a cardiology consultation as a result. Will refer her for consultation. She was referred previously but declined stress test. She is now willing to proceed.

## 2016-10-16 NOTE — Assessment & Plan Note (Signed)
Patient encouraged to maintain heart healthy diet, regular exercise, adequate sleep. Consider daily probiotics. Take medications as prescribed. Labs today. Given and reviewed copy of ACP documents from [place] Secretary of State and encouraged to complete and return 

## 2016-10-20 NOTE — Progress Notes (Signed)
Subjective:    Patient ID: Kelli CarrowDeanna Barry, female    DOB: 04/03/1965, 51 y.o.   MRN: [id number]  Chief Complaint  Patient presents with  . Annual Exam    HPI Patient is in today for annual preventative exam. She has a PMH significiant for diabetes, vitamin D deficiency, hypercholesterolemia and a family history of CAD which she is worried more about lately since another family member was recently diagnosed with heart disease. She has been struggling with a rash on both of her feet. The rash is not pruritic and it is stable. She has been seen by dermatology and was given a steroid cream but that has not been helpful. She otherwise feels well. She denies any recent febrile illness or hospitalization. Denies CP/palp/SOB/HA/congestion/fevers/GI or GU c/o. Taking meds as prescribed. She maintains a heart healthy diet and exercises intermittently  Past Medical History:  Diagnosis Date  . Abdominal pain 08/06/2014  . Acute bronchitis 12/15/2010  . Allergy    seasonal  . Anxiety 10/16/2016  . Diabetes mellitus without complication (HCC)    diet controlled  . Diabetes type 2, controlled (HCC) 08/06/2014   Follows with Dr Talmage NapBalan  . Hyperlipidemia   . Hypothyroidism 08/06/2014  . Osteoporosis   . Osteoporosis 12/05/2009   Qualifier: Diagnosis of  By: Abner GreenspanBlyth MD, Misty StanleyStacey    . Preventative health care 08/06/2014  . Rash 10/16/2016    Past Surgical History:  Procedure Laterality Date  . NO PAST SURGERIES      Family History  Problem Relation Age of Onset  . Diabetes Father        DM II  . Hypertension Father   . Melanoma Father   . Dementia Father   . Memory loss Father   . Hypertension Mother   . Fibroids Mother        uterine  . Hyperlipidemia Mother   . Stroke Mother   . Cancer Maternal Grandmother        colon  . Coronary artery disease Maternal Grandmother   . Heart attack Maternal Grandmother   . Colon cancer Maternal Grandmother 75  . Heart disease Maternal Grandfather     MI at roughly 2050  . Stroke Paternal Grandmother   . Dementia Paternal Grandmother   . Cancer Paternal Grandfather        Lung  . Emphysema Paternal Grandfather   . Ovarian cysts Sister   . Heart disease Maternal Aunt 72       MI  . Heart disease Maternal Uncle [id number]       ish  . Heart disease Maternal Uncle [id number]       ish, MI  . Allergies Son     Social History   Social History  . Marital status: Married    Spouse name: N/A  . Number of children: N/A  . Years of education: N/A   Occupational History  . Not on file.   Social History Main Topics  . Smoking status: Never Smoker  . Smokeless tobacco: Never Used  . Alcohol use 0.0 oz/week  . Drug use: No  . 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 Not on file   Social History Narrative  . No narrative on file    Outpatient Medications Prior to Visit  Medication Sig Dispense Refill  . estradiol (VIVELLE-DOT) 0.05 MG/24HR patch  Place 1 patch onto the skin 2 (two) times a week.    . levothyroxine (SYNTHROID, LEVOTHROID) 75 MCG tablet Take 75 mcg by mouth daily before breakfast.    . ONE TOUCH ULTRA TEST test strip 1 strip.  4  . progesterone (PROMETRIUM) 100 MG capsule Take 100 mg by mouth daily.    . Progesterone Micronized (PROGESTERONE PO) Take by mouth.    . risedronate (ACTONEL) 35 MG tablet Take 35 mg by mouth once a week. osteoporosis  8  . Vitamin D, Ergocalciferol, (DRISDOL) 50000 units CAPS capsule Take 50,000 Units by mouth every 7 (seven) days.    Marland Kitchen doxycycline (VIBRA-TABS) 100 MG tablet Take 1 tablet (100 mg total) by mouth 2 (two) times daily. 14 tablet 0   No facility-administered medications prior to visit.     Allergies  Allergen Reactions  . Codeine   . Penicillins     Review of Systems  Constitutional: Negative for chills, fever and malaise/fatigue.  HENT:  Negative for congestion and hearing loss.   Eyes: Negative for discharge.  Respiratory: Negative for cough, sputum production and shortness of breath.   Cardiovascular: Negative for chest pain, palpitations and leg swelling.  Gastrointestinal: Negative for abdominal pain, blood in stool, constipation, diarrhea, heartburn, nausea and vomiting.  Genitourinary: Negative for dysuria, frequency, hematuria and urgency.  Musculoskeletal: Negative for back pain, falls and myalgias.  Skin: Positive for rash. Negative for itching.  Neurological: Negative for dizziness, sensory change, loss of consciousness, weakness and headaches.  Endo/Heme/Allergies: Negative for environmental allergies. Does not bruise/bleed easily.  Psychiatric/Behavioral: Negative for depression and suicidal ideas. The patient is nervous/anxious. The patient does not have insomnia.        Objective:    Physical Exam  Constitutional: She is oriented to person, place, and time. She appears well-developed and well-nourished. No distress.  HENT:  Head: Normocephalic and atraumatic.  Mouth/Throat: Oropharynx is clear and moist.  Eyes: Conjunctivae are normal.  Neck: Neck supple. No thyromegaly present.  Cardiovascular: Normal rate, regular rhythm and normal heart sounds.   No murmur heard. Pulmonary/Chest: Effort normal and breath sounds normal. No respiratory distress.  Abdominal: Soft. Bowel sounds are normal. She exhibits no distension and no mass. There is no tenderness.  Musculoskeletal: She exhibits no edema.  Lymphadenopathy:    She has no cervical adenopathy.  Neurological: She is alert and oriented to person, place, and time.  Skin: Skin is warm and dry.  Psychiatric: She has a normal mood and affect. Her behavior is normal.    BP 93/64 (BP Location: Left Arm, Patient Position: Sitting, Cuff Size: Normal)   Pulse 84   Temp 98.4 F (36.9 C) (Oral)   Ht 5\' 1"  (1.549 m)   Wt 104 lb 12.8 oz (47.5 kg)   LMP  01/14/2011   SpO2 100%   BMI 19.80 kg/m  Wt Readings from Last 3 Encounters:  10/16/16 104 lb 12.8 oz (47.5 kg)  02/25/16 106 lb 9.6 oz (48.4 kg)  10/04/15 100 lb 6 oz (45.5 kg)     Lab Results  Component Value Date   WBC 6.3 10/16/2016   HGB 14.1 10/16/2016   HCT 42.7 10/16/2016   PLT 261.0 10/16/2016   GLUCOSE 80 10/16/2016   CHOL 218 (H) 10/16/2016   TRIG 103.0 10/16/2016   HDL 76.40 10/16/2016   LDLCALC 121 (H) 10/16/2016   ALT 14 10/16/2016   AST 20 10/16/2016   NA 139 10/16/2016   K 4.4 10/16/2016  CL 104 10/16/2016   CREATININE 0.72 10/16/2016   BUN 20 10/16/2016   CO2 27 10/16/2016   HGBA1C 5.8 10/16/2016    No results found for: TSH Lab Results  Component Value Date   WBC 6.3 10/16/2016   HGB 14.1 10/16/2016   HCT 42.7 10/16/2016   MCV 90.7 10/16/2016   PLT 261.0 10/16/2016   Lab Results  Component Value Date   NA 139 10/16/2016   K 4.4 10/16/2016   CO2 27 10/16/2016   GLUCOSE 80 10/16/2016   BUN 20 10/16/2016   CREATININE 0.72 10/16/2016   BILITOT 0.4 10/16/2016   ALKPHOS 62 10/16/2016   AST 20 10/16/2016   ALT 14 10/16/2016   PROT 7.2 10/16/2016   ALBUMIN 4.4 10/16/2016   CALCIUM 9.9 10/16/2016   GFR 89.86 10/16/2016   Lab Results  Component Value Date   CHOL 218 (H) 10/16/2016   Lab Results  Component Value Date   HDL 76.40 10/16/2016   Lab Results  Component Value Date   LDLCALC 121 (H) 10/16/2016   Lab Results  Component Value Date   TRIG 103.0 10/16/2016   Lab Results  Component Value Date   CHOLHDL 3 10/16/2016   Lab Results  Component Value Date   HGBA1C 5.8 10/16/2016       Assessment & Plan:   Problem List Items Addressed This Visit    Vitamin D deficiency    Saw her Endocrinologist in March and was told her D was low. Recheck vitamin D today      Relevant Orders   VITAMIN D 25 Hydroxy (Vit-D Deficiency, Fractures) (Completed)   MIXED HYPERLIPIDEMIA    Encouraged heart healthy diet, increase exercise,  avoid trans fats, consider a krill oil cap daily      Relevant Orders   Lipid panel (Completed)   Asymptomatic postmenopausal status    Follows with GYN Dr Guilford Shi and they manage her mgms, dexa scans and paps      Relevant Orders   Ambulatory referral to Cardiology   Hypothyroidism    On Levothyroxine, continue to monitor      Relevant Orders   Ambulatory referral to Cardiology   Diabetes type 2, controlled (HCC)    hgba1c acceptable, minimize simple carbs. Increase exercise as tolerated. Dr Talmage Nap follows. She has had another family member diagnosed with CAD and she is interested in a cardiology consultation as a result. Will refer her for consultation. She was referred previously but declined stress test. She is now willing to proceed.      Relevant Orders   Hemoglobin A1c (Completed)   Comprehensive metabolic panel (Completed)   Ambulatory referral to Cardiology   Preventative health care    Patient encouraged to maintain heart healthy diet, regular exercise, adequate sleep. Consider daily probiotics. Take medications as prescribed. Labs today. Given and reviewed copy of ACP documents from [facility] Secretary of State and encouraged to complete and return      Relevant Orders   CBC (Completed)   Anxiety   Relevant Medications   ALPRAZolam (XANAX) 0.25 MG tablet   Rash    On feet, not pruritic and has been seen by dermatology and those prescriptions were not helpful. Unclear etiology.        Other Visit Diagnoses    Need for Tdap vaccination    -  Primary   Relevant Orders   Tdap vaccine greater than or equal to 7yo IM (Completed)      I have discontinued  Ms. Faxon doxycycline. I am also having her start on ALPRAZolam. Additionally, I am having her maintain her estradiol, Progesterone Micronized (PROGESTERONE PO), risedronate, ONE TOUCH ULTRA TEST, progesterone, levothyroxine, and Vitamin D (Ergocalciferol).  Meds ordered this encounter  Medications  . DISCONTD: Tdap  (BOOSTRIX) injection 0.5 mL  . ALPRAZolam (XANAX) 0.25 MG tablet    Sig: Take 1 tablet (0.25 mg total) by mouth 2 (two) times daily as needed for anxiety.    Dispense:  30 tablet    Refill:  0     Danise Edge, MD

## 2016-10-30 ENCOUNTER — Ambulatory Visit (INDEPENDENT_AMBULATORY_CARE_PROVIDER_SITE_OTHER): Payer: 59 | Admitting: Obstetrics & Gynecology

## 2016-10-30 ENCOUNTER — Encounter: Payer: Self-pay | Admitting: Obstetrics & Gynecology

## 2016-10-30 VITALS — BP 104/60 | Ht 61.0 in | Wt 104.0 lb

## 2016-10-30 DIAGNOSIS — M810 Age-related osteoporosis without current pathological fracture: Secondary | ICD-10-CM

## 2016-10-30 DIAGNOSIS — Z780 Asymptomatic menopausal state: Secondary | ICD-10-CM

## 2016-10-30 DIAGNOSIS — Z01419 Encounter for gynecological examination (general) (routine) without abnormal findings: Secondary | ICD-10-CM

## 2016-10-30 MED ORDER — ESTRADIOL 0.05 MG/24HR TD PTTW: 1.0000 | MEDICATED_PATCH | TRANSDERMAL | 4 refills | Status: DC

## 2016-10-30 MED ORDER — ESTRADIOL 0.1 MG/GM VA CREA: 0.2500 | TOPICAL_CREAM | VAGINAL | 4 refills | Status: DC

## 2016-10-30 MED ORDER — PROGESTERONE MICRONIZED 100 MG PO CAPS: 100.0000 mg | ORAL_CAPSULE | Freq: Every day | ORAL | 4 refills | Status: DC

## 2016-10-30 NOTE — Patient Instructions (Signed)
1. Encounter for routine gynecological examination with Papanicolaou smear of cervix Normal gyn exam.  Pap reflex done.  Breasts wnl.  Scheduled screening mammo.    2. Menopause present Well on HRT.  Vivelle Dot and Prometrium represcribed.  No CI.  Taking x <10 years.  Estrace cream for Atrophic Vaginitis. Preventive Kegels recommended and instructed.  3. Age related osteoporosis, unspecified pathological fracture presence Osteoporosis per last BD 2015.  On Actonel x >2 years.  D/C Actonel now.  Repeat Bone Density.  Will f/u to discuss results and management.  Probably a good candidate for Prolia.  Vit D supplement/Ca++ in nutrition.  Weight bearing physical activity. - DG Bone Density; Future  Kamiryn, it was a pleasure to see you today!  I will inform you of your results as soon as available.   Kegel Exercises Kegel exercises help strengthen the muscles that support the rectum, vagina, small intestine, bladder, and uterus. Doing Kegel exercises can help:  Improve bladder and bowel control.  Improve sexual response.  Reduce problems and discomfort during pregnancy.  Kegel exercises involve squeezing your pelvic floor muscles, which are the same muscles you squeeze when you try to stop the flow of urine. The exercises can be done while sitting, standing, or lying down, but it is best to vary your position. Phase 1 exercises 1. Squeeze your pelvic floor muscles tight. You should feel a tight lift in your rectal area. If you are a female, you should also feel a tightness in your vaginal area. Keep your stomach, buttocks, and legs relaxed. 2. Hold the muscles tight for up to 10 seconds. 3. Relax your muscles. Repeat this exercise 50 times a day or as many times as told by your health care provider. Continue to do this exercise for at least 4-6 weeks or for as long as told by your health care provider. This information is not intended to replace advice given to you by your health care  provider. Make sure you discuss any questions you have with your health care provider. Document Released: 01/21/2012 Document Revised: 09/29/2015 Document Reviewed: 12/24/2014 Elsevier Interactive Patient Education  [employer]2018 [employer]

## 2016-10-30 NOTE — Progress Notes (Signed)
Kelli Barry Sep 03, 1965 [id number]   History:    51 y.o. G2P2 Married.  Daughter 92, son 35.  RP:  Established patient presenting for annual gyn exam   HPI:  Menopause x about 51 yo.  On HRT x <10 yrs.  Well on Vivelle-dot 0.05 and Prometrium 100 mg HS.  Estrace cream helping for IC.  No PMB.  No pelvic pain.  Breasts wnl.  Osteoporosis on Actonel x 2 years.  Vit D/Ca++.  Fit and eating well.  Mictions/BMs wnl.  Past medical history,surgical history, family history and social history were all reviewed and documented in the EPIC chart.  Gynecologic History Patient's last menstrual period was 01/14/2011. Contraception: post menopausal status Last Pap: 2017. Results were: normal Last mammogram: 2017. Results were: normal Bone density 2015 [place] 2016  Obstetric History OB History  No data available     ROS: A ROS was performed and pertinent positives and negatives are included in the history.  GENERAL: No fevers or chills. HEENT: No change in vision, no earache, sore throat or sinus congestion. NECK: No pain or stiffness. CARDIOVASCULAR: No chest pain or pressure. No palpitations. PULMONARY: No shortness of breath, cough or wheeze. GASTROINTESTINAL: No abdominal pain, nausea, vomiting or diarrhea, melena or bright red blood per rectum. GENITOURINARY: No urinary frequency, urgency, hesitancy or dysuria. MUSCULOSKELETAL: No joint or muscle pain, no back pain, no recent trauma. DERMATOLOGIC: No rash, no itching, no lesions. ENDOCRINE: No polyuria, polydipsia, no heat or cold intolerance. No recent change in weight. HEMATOLOGICAL: No anemia or easy bruising or bleeding. NEUROLOGIC: No headache, seizures, numbness, tingling or weakness. PSYCHIATRIC: No depression, no loss of interest in normal activity or change in sleep pattern.     Exam:   BP 104/60   Ht  (1.549 m)   Wt 104 lb (47.2 kg)   LMP 01/14/2011   BMI 19.65 kg/m   Body mass index is 19.65 kg/m.  General  appearance : Well developed well nourished female. No acute distress HEENT: Eyes: no retinal hemorrhage or exudates,  Neck supple, trachea midline, no carotid bruits, no thyroidmegaly Lungs: Clear to auscultation, no rhonchi or wheezes, or rib retractions  Heart: Regular rate and rhythm, no murmurs or gallops Breast:Examined in sitting and supine position were symmetrical in appearance, no palpable masses or tenderness,  no skin retraction, no nipple inversion, no nipple discharge, no skin discoloration, no axillary or supraclavicular lymphadenopathy Abdomen: no palpable masses or tenderness, no rebound or guarding Extremities: no edema or skin discoloration or tenderness  Pelvic: Vulva normal  Bartholin, Urethra, Skene Glands: Within normal limits             Vagina: No gross lesions or discharge  Cervix: No gross lesions or discharge.  Pap reflex.  Uterus  AV, normal size, shape and consistency, non-tender and mobile  Adnexa  Without masses or tenderness  Anus and perineum  normal     Assessment/Plan:  51 y.o. female for annual exam   1. Encounter for routine gynecological examination with Papanicolaou smear of cervix Normal gyn exam.  Pap reflex done.  Breasts wnl.  Scheduled screening mammo.    2. Menopause present Well on HRT.  Vivelle Dot and Prometrium represcribed.  No CI.  Taking x <10 years.  Estrace cream for Atrophic Vaginitis. Preventive Kegels recommended and instructed.  3. Age related osteoporosis, unspecified pathological fracture presence Osteoporosis per last BD 2015.  On Actonel x >2 years.  D/C Actonel now.  Repeat Bone  Density.  Will f/u to discuss results and management.  Probably a good candidate for Prolia.  Vit D supplement/Ca++ in nutrition.  Weight bearing physical activity. - DG Bone Density; Future  Counseling on above issues >50% x 10 minutes  Kelli DelMarie-Lyne Aalaiyah Yassin MD, 9:19 AM 10/30/2016

## 2016-10-30 NOTE — Addendum Note (Signed)
Addended by: Richardson ChiquitoWILKINSON, Meranda Dechaine S on: 10/30/2016 12:15 PM   Modules accepted: Orders

## 2016-10-31 LAB — PAP IG W/ RFLX HPV ASCU

## 2016-12-18 ENCOUNTER — Ambulatory Visit: Payer: 59 | Admitting: Cardiovascular Disease

## 2017-01-14 ENCOUNTER — Encounter: Payer: Self-pay | Admitting: Cardiovascular Disease

## 2017-01-14 ENCOUNTER — Ambulatory Visit (INDEPENDENT_AMBULATORY_CARE_PROVIDER_SITE_OTHER): Payer: 59 | Admitting: Cardiovascular Disease

## 2017-01-14 VITALS — BP 115/70 | HR 70 | Ht 61.0 in | Wt 104.2 lb

## 2017-01-14 DIAGNOSIS — E119 Type 2 diabetes mellitus without complications: Secondary | ICD-10-CM | POA: Diagnosis not present

## 2017-01-14 DIAGNOSIS — E782 Mixed hyperlipidemia: Secondary | ICD-10-CM

## 2017-01-14 NOTE — Progress Notes (Signed)
Cardiology Office Note   Date:  01/14/2017   ID:  Kelli Barry, DOB 10-02-1965, MRN [id number]  PCP:  Bradd CanaryBlyth, Stacey A, MD  Cardiologist:   Chilton Siiffany [place], MD   Chief Complaint  Patient presents with  . New Patient (Initial Visit)     History of Present Illness: Kelli Barry is a 51 y.o. female with diabetes, hyperlipidemia, and scoliosis who is being seen today for the evaluation of family history of CAD at the request of Bradd CanaryBlyth, Stacey A, MD.  She was previously seen 09/2014 for an evaluation of atypical chest pain.  No ischemia workup was indicated at that time.  She recently followed up with Dr. Rogelia RohrerBlythe and wanted to follow-up with cardiology again due to her mother's recent diagnosis of atrial fibrillation and stroke.  She has been feeling well physically.  Her only complaint is tingling in her left hand and foot.  She is also noted a rash in bilateral feet.  This is been ongoing for the last 6 months.  The tingling is worse when lying in bed at night.  It comes and goes intermittently.  She has no claudication or pain.  She has not noted any lower extremity edema, orthopnea, or PND.  She denies any recent injuries.  She has not been exercising much lately due to being busy with work and caring for her elderly parents.   Past Medical History:  Diagnosis Date  . Abdominal pain 08/06/2014  . Acute bronchitis 12/15/2010  . Allergy    seasonal  . Anxiety 10/16/2016  . Diabetes mellitus without complication (HCC)    diet controlled  . Diabetes type 2, controlled (HCC) 08/06/2014   Follows with Dr Talmage NapBalan  . Hyperlipidemia   . Hypothyroidism 08/06/2014  . Osteoporosis   . Osteoporosis 12/05/2009   Qualifier: Diagnosis of  By: Abner GreenspanBlyth MD, Misty StanleyStacey    . Preventative health care 08/06/2014  . Rash 10/16/2016    Past Surgical History:  Procedure Laterality Date  . NO PAST SURGERIES       Current Outpatient Medications  Medication Sig Dispense Refill  . ALPRAZolam (XANAX) 0.25 MG  tablet Take 1 tablet (0.25 mg total) by mouth 2 (two) times daily as needed for anxiety. 30 tablet 0  . estradiol (ESTRACE VAGINAL) 0.1 MG/GM vaginal cream Place 0.25 Applicatorfuls vaginally 2 (two) times a week. Apply a thin layer on vulva 2 times a week as needed. 42.5 g 4  . estradiol (VIVELLE-DOT) 0.05 MG/24HR patch Place 1 patch (0.05 mg total) onto the skin 2 (two) times a week. 24 patch 4  . levothyroxine (SYNTHROID, LEVOTHROID) 75 MCG tablet Take 75 mcg by mouth daily before breakfast.    . ONE TOUCH ULTRA TEST test strip 1 strip.  4  . progesterone (PROMETRIUM) 100 MG capsule Take 1 capsule (100 mg total) by mouth at bedtime. 90 capsule 4  . risedronate (ACTONEL) 35 MG tablet Take 35 mg by mouth once a week. osteoporosis  8  . Vitamin D, Ergocalciferol, (DRISDOL) 50000 units CAPS capsule Take 50,000 Units by mouth every 7 (seven) days.     No current facility-administered medications for this visit.     Allergies:   Codeine and Penicillins    Social History:  The patient  reports that  has never smoked. she has never used smokeless tobacco. She reports that she drinks about 1.2 oz of alcohol per week. She reports that she does not use drugs.   Family History:  The patient's  family history includes Allergies in her son; Atrial fibrillation in her mother; Cancer in her maternal grandmother and paternal grandfather; Colon cancer (age of onset: 72) in her maternal grandmother; Coronary artery disease in her maternal grandmother; Dementia in her father and paternal grandmother; Diabetes in her father; Emphysema in her paternal grandfather; Fibroids in her mother; Heart attack in her maternal grandmother; Heart disease in her maternal grandfather; Heart disease (age of onset: 25) in her maternal uncle; Heart disease (age of onset: 43) in her maternal uncle; Heart disease (age of onset: 14) in her maternal aunt; Hyperlipidemia in her mother; Hypertension in her father and mother; Melanoma in her  father; Memory loss in her father; Ovarian cysts in her sister; Stroke in her mother and paternal grandmother.    ROS:  Please see the history of present illness.   Otherwise, review of systems are positive for none.   All other systems are reviewed and negative.    PHYSICAL EXAM: VS:  BP 115/70   Pulse 70   Ht 5\' 1"  (1.549 m)   Wt 104 lb 3.2 oz (47.3 kg)   LMP 01/14/2011   BMI 19.69 kg/m  , BMI Body mass index is 19.69 kg/m. GENERAL:  Well appearing HEENT:  Pupils equal round and reactive, fundi not visualized, oral mucosa unremarkable NECK:  No jugular venous distention, waveform within normal limits, carotid upstroke brisk and symmetric, no bruits, no thyromegaly LUNGS:  Clear to auscultation bilaterally HEART:  RRR.  PMI not displaced or sustained,S1 and S2 within normal limits, no S3, no S4, no clicks, no rubs, no murmurs ABD:  Flat, positive bowel sounds normal in frequency in pitch, no bruits, no rebound, no guarding, no midline pulsatile mass, no hepatomegaly, no splenomegaly EXT:  2 plus pulses throughout, no edema, no cyanosis no clubbing SKIN:  No rashes no nodules NEURO:  Cranial nerves II through XII grossly intact, motor grossly intact throughout PSYCH:  Cognitively intact, oriented to person place and time    EKG:  EKG is ordered today. The ekg ordered today demonstrates sinus rhythm.  Rate 70 bpm.   Recent Labs: 10/16/2016: ALT 14; BUN 20; Creatinine, Ser 0.72; Hemoglobin 14.1; Platelets 261.0; Potassium 4.4; Sodium 139    Lipid Panel    Component Value Date/Time   CHOL 218 (H) 10/16/2016 0947   TRIG 103.0 10/16/2016 0947   HDL 76.40 10/16/2016 0947   CHOLHDL 3 10/16/2016 0947   VLDL 20.6 10/16/2016 0947   LDLCALC 121 (H) 10/16/2016 0947      Wt Readings from Last 3 Encounters:  01/14/17 104 lb 3.2 oz (47.3 kg)  10/30/16 104 lb (47.2 kg)  10/16/16 104 lb 12.8 oz (47.5 kg)      ASSESSMENT AND PLAN:  # Family history of CAD: # Hyperlipidemia:    Kelli Barry is asymptomatic from a coronary standpoint.  She has hyperlipidemia and an ASCVD 10-year risk of 2%.  Her diabetes is diet controlled.  We will get a coronary calcium score to better determine if she needs to be on a statin or aspirin.  # L UE/LE tingling: Likely 2/2 cervical disease given that it is unilateral and she has scoliosis.  Unlikely to be diabetic neuropathy given her well-controlled hemoglobin A1c.  She has good peripheral blood flow bilaterally.   Current medicines are reviewed at length with the patient today.  The patient does not have concerns regarding medicines.  The following changes have been made:  no change  Labs/ tests ordered  today include:   Orders Placed This Encounter  Procedures  . CT CARDIAC SCORING  . EKG 12-Lead     Disposition:   FU with Jaylynn Mcaleer C. Duke Salviaandolph, MD, [facility] in 2 years     This note was written with the assistance of speech recognition software.  Please excuse any transcriptional errors.  Signed, Eastin Swing C. Duke Salviaandolph, MD, [facility]  01/14/2017 8:45 AM    [facility]

## 2017-01-14 NOTE — Patient Instructions (Addendum)
Medication Instructions:  Your physician recommends that you continue on your current medications as directed. Please refer to the Current Medication list given to you today.  Labwork: none  Testing/Procedures: CALCIUM SCORE $150 OUT OF POCKET [facility] AT [address]  Follow-Up: Your physician wants you to follow-up in: 2 year ov You will receive a reminder letter in the mail two months in advance. If you don't receive a letter, please call our office to schedule the follow-up appointment.

## 2017-01-16 ENCOUNTER — Ambulatory Visit (INDEPENDENT_AMBULATORY_CARE_PROVIDER_SITE_OTHER): Payer: 59 | Admitting: Family Medicine

## 2017-01-16 ENCOUNTER — Encounter: Payer: Self-pay | Admitting: Family Medicine

## 2017-01-16 VITALS — BP 111/68 | HR 71 | Temp 98.4°F | Resp 18 | Ht 61.0 in | Wt 105.8 lb

## 2017-01-16 DIAGNOSIS — E039 Hypothyroidism, unspecified: Secondary | ICD-10-CM | POA: Diagnosis not present

## 2017-01-16 DIAGNOSIS — Z1231 Encounter for screening mammogram for malignant neoplasm of breast: Secondary | ICD-10-CM

## 2017-01-16 DIAGNOSIS — E119 Type 2 diabetes mellitus without complications: Secondary | ICD-10-CM | POA: Diagnosis not present

## 2017-01-16 DIAGNOSIS — Z0000 Encounter for general adult medical examination without abnormal findings: Secondary | ICD-10-CM | POA: Diagnosis not present

## 2017-01-16 DIAGNOSIS — E559 Vitamin D deficiency, unspecified: Secondary | ICD-10-CM | POA: Diagnosis not present

## 2017-01-16 DIAGNOSIS — E782 Mixed hyperlipidemia: Secondary | ICD-10-CM

## 2017-01-16 DIAGNOSIS — M810 Age-related osteoporosis without current pathological fracture: Secondary | ICD-10-CM

## 2017-01-16 DIAGNOSIS — Z1239 Encounter for other screening for malignant neoplasm of breast: Secondary | ICD-10-CM

## 2017-01-16 LAB — CBC
HCT: 40.8 % (ref 36.0–46.0)
Hemoglobin: 13.5 g/dL (ref 12.0–15.0)
MCHC: 33.2 g/dL (ref 30.0–36.0)
MCV: 89.6 fl (ref 78.0–100.0)
Platelets: 277 10*3/uL (ref 150.0–400.0)
RBC: 4.55 Mil/uL (ref 3.87–5.11)
RDW: 13.1 % (ref 11.5–15.5)
WBC: 10.3 10*3/uL (ref 4.0–10.5)

## 2017-01-16 LAB — COMPREHENSIVE METABOLIC PANEL
ALT: 9 U/L (ref 0–35)
AST: 14 U/L (ref 0–37)
Albumin: 4.4 g/dL (ref 3.5–5.2)
Alkaline Phosphatase: 60 U/L (ref 39–117)
BUN: 19 mg/dL (ref 6–23)
CO2: 29 mEq/L (ref 19–32)
Calcium: 9.6 mg/dL (ref 8.4–10.5)
Chloride: 102 mEq/L (ref 96–112)
Creatinine, Ser: 0.79 mg/dL (ref 0.40–1.20)
GFR: 80.65 mL/min (ref >60.00)
Glucose, Bld: 90 mg/dL (ref 70–99)
Potassium: 4.4 mEq/L (ref 3.5–5.1)
Sodium: 138 mEq/L (ref 135–145)
Total Bilirubin: 0.6 mg/dL (ref 0.2–1.2)
Total Protein: 7.2 g/dL (ref 6.0–8.3)

## 2017-01-16 LAB — HEMOGLOBIN A1C: Hgb A1c MFr Bld: 5.7 % (ref 4.6–6.5)

## 2017-01-16 LAB — LIPID PANEL
Cholesterol: 195 mg/dL (ref 0–200)
HDL: 65.3 mg/dL (ref >39.00)
LDL Cholesterol: 117 mg/dL — ABNORMAL HIGH (ref 0–99)
NonHDL: 129.93
Total CHOL/HDL Ratio: 3
Triglycerides: 66 mg/dL (ref 0.0–149.0)
VLDL: 13.2 mg/dL (ref 0.0–40.0)

## 2017-01-16 LAB — VITAMIN D 25 HYDROXY (VIT D DEFICIENCY, FRACTURES): VITD: 25.51 ng/mL — ABNORMAL LOW (ref 30.00–100.00)

## 2017-01-16 NOTE — Patient Instructions (Signed)
Osteoporosis Osteoporosis happens when your bones become thinner and weaker. Weak bones can break (fracture) more easily when you slip or fall. Bones most at risk of breaking are in the hip, wrist, and spine. Follow these instructions at home:  Get enough calcium and vitamin D. These nutrients are good for your bones.  Exercise as told by your doctor.  Do not use any tobacco products. This includes cigarettes, chewing tobacco, and electronic cigarettes. If you need help quitting, ask your doctor.  Limit the amount of alcohol you drink.  Take medicines only as told by your doctor.  Keep all follow-up visits as told by your doctor. This is important.  Take care at home to prevent falls. Some ways to do this are: ? Keep rooms well lit and tidy. ? Put safety rails on your stairs. ? Put a rubber mat in the bathroom and other places that are often wet or slippery. Get help right away if:  You fall.  You hurt yourself. This information is not intended to replace advice given to you by your health care provider. Make sure you discuss any questions you have with your health care provider. Document Released: 04/28/2011 Document Revised: 07/12/2015 Document Reviewed: 07/14/2013 Elsevier Interactive Patient Education  2018 Elsevier Inc.  

## 2017-01-16 NOTE — Assessment & Plan Note (Signed)
hgba1c acceptable, minimize simple carbs. Increase exercise as tolerated.  

## 2017-01-16 NOTE — Assessment & Plan Note (Signed)
On Actonel for past two years, previously on it for roughly a year.check Dexa scan ordered

## 2017-01-16 NOTE — Assessment & Plan Note (Addendum)
MM ordered today, patient declines pneumonia and is low risk.

## 2017-01-16 NOTE — Assessment & Plan Note (Signed)
Encouraged heart healthy diet, increase exercise, avoid trans fats, consider a krill oil cap daily 

## 2017-01-16 NOTE — Assessment & Plan Note (Signed)
Check vitamin D today., endocrinology has recommended she stay on the high dose of Vitamin D.

## 2017-01-16 NOTE — Progress Notes (Signed)
Subjective:  I acted as a Neurosurgeon for Dr. Abner Greenspan. Kelli Barry, Kelli Barry  Patient ID: Kelli Barry, female    DOB: 01-15-1966, 51 y.o.   MRN: [id number]  No chief complaint on file.   HPI  Patient is in today for a 3 month follow up and overall she is doing well.  She denies any recent febrile illness or acute hospitalization.  No polyuria or polydipsia.  Continues to maintain a heart healthy diet with minimal carbohydrates on most days but is not exercising regularly due to multiple family stressors. Denies CP/palp/SOB/HA/congestion/fevers/GI or GU c/o. Taking meds as prescribed  Patient Care Team: Bradd Canary, MD as PCP - General Juanda Chance Hedwig Morton, MD (Inactive) as Consulting Physician (Gastroenterology) Genia Del, MD as Consulting Physician (Obstetrics and Gynecology) Dorisann Frames, MD as Consulting Physician (Endocrinology) Elmon Else, MD as Consulting Physician (Dermatology)   Past Medical History:  Diagnosis Date  . Abdominal pain 08/06/2014  . Acute bronchitis 12/15/2010  . Allergy    seasonal  . Anxiety 10/16/2016  . Diabetes mellitus without complication (HCC)    diet controlled  . Diabetes type 2, controlled (HCC) 08/06/2014   Follows with Dr Talmage Nap  . Hyperlipidemia   . Hypothyroidism 08/06/2014  . Osteoporosis   . Osteoporosis 12/05/2009   Qualifier: Diagnosis of  By: Abner Greenspan MD, Misty Stanley    . Preventative health care 08/06/2014  . Rash 10/16/2016    Past Surgical History:  Procedure Laterality Date  . NO PAST SURGERIES      Family History  Problem Relation Age of Onset  . Diabetes Father        DM II  . Hypertension Father   . Melanoma Father   . Dementia Father   . Memory loss Father   . Hypertension Mother   . Fibroids Mother        uterine  . Hyperlipidemia Mother   . Stroke Mother   . Atrial fibrillation Mother   . Cancer Maternal Grandmother        colon  . Coronary artery disease Maternal Grandmother   . Heart attack Maternal Grandmother     . Colon cancer Maternal Grandmother 75  . Heart disease Maternal Grandfather        MI at roughly 18  . Stroke Paternal Grandmother   . Dementia Paternal Grandmother   . Cancer Paternal Grandfather        Lung  . Emphysema Paternal Grandfather   . Ovarian cysts Sister   . Heart disease Maternal Aunt 72       MI  . Heart disease Maternal Uncle 20       ish  . Heart disease Maternal Uncle 92       ish, MI  . Allergies Son     Social History   Socioeconomic History  . Marital status: Married    Spouse name: Not on file  . Number of children: Not on file  . Years of education: Not on file  . Highest education level: Not on file  Social Needs  . Financial resource strain: Not on file  . Food insecurity - worry: Not on file  . Food insecurity - inability: Not on file  . Transportation needs - medical: Not on file  . Transportation needs - non-medical: Not on file  Occupational History  . Not on file  Tobacco Use  . Smoking status: Never Smoker  . Smokeless tobacco: Never Used  Substance and Sexual Activity  . Alcohol use:  Yes    Alcohol/week: 1.2 oz    Types: 2 Glasses of wine per week  . Drug use: No  . 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 Not on file  Social History Narrative  . Not on file    Outpatient Medications Prior to Visit  Medication Sig Dispense Refill  . ALPRAZolam (XANAX) 0.25 MG tablet Take 1 tablet (0.25 mg total) by mouth 2 (two) times daily as needed for anxiety. 30 tablet 0  . estradiol (ESTRACE VAGINAL) 0.1 MG/GM vaginal cream Place 0.25 Applicatorfuls vaginally 2 (two) times a week. Apply a thin layer on vulva 2 times a week as needed. 42.5 g 4  . estradiol (VIVELLE-DOT) 0.05 MG/24HR patch Place 1 patch (0.05 mg total) onto the skin 2 (two) times a week. 24 patch 4   . levothyroxine (SYNTHROID, LEVOTHROID) 75 MCG tablet Take 75 mcg by mouth daily before breakfast.    . ONE TOUCH ULTRA TEST test strip 1 strip.  4  . progesterone (PROMETRIUM) 100 MG capsule Take 1 capsule (100 mg total) by mouth at bedtime. 90 capsule 4  . risedronate (ACTONEL) 35 MG tablet Take 35 mg by mouth once a week. osteoporosis  8  . Vitamin D, Ergocalciferol, (DRISDOL) 50000 units CAPS capsule Take 50,000 Units by mouth every 7 (seven) days.     No facility-administered medications prior to visit.     Allergies  Allergen Reactions  . Codeine   . Penicillins     Review of Systems  Constitutional: Negative for fever and malaise/fatigue.  HENT: Negative for congestion.   Eyes: Negative for blurred vision.  Respiratory: Negative for shortness of breath.   Cardiovascular: Negative for chest pain, palpitations and leg swelling.  Gastrointestinal: Negative for abdominal pain, blood in stool and nausea.  Genitourinary: Negative for dysuria and frequency.  Musculoskeletal: Negative for falls.  Skin: Negative for rash.  Neurological: Negative for dizziness, loss of consciousness and headaches.  Endo/Heme/Allergies: Negative for environmental allergies.  Psychiatric/Behavioral: Negative for depression. The patient is not nervous/anxious.        Objective:    Physical Exam  Constitutional: She is oriented to person, place, and time. She appears well-developed and well-nourished. No distress.  HENT:  Head: Normocephalic and atraumatic.  Nose: Nose normal.  Eyes: Right eye exhibits no discharge. Left eye exhibits no discharge.  Neck: Normal range of motion. Neck supple.  Cardiovascular: Normal rate and regular rhythm.  No murmur heard. Pulmonary/Chest: Effort normal and breath sounds normal.  Abdominal: Soft. Bowel sounds are normal. There is no tenderness.  Musculoskeletal: She exhibits no edema.  Neurological: She is alert and oriented to person, place, and time.  Skin:  Skin is warm and dry.  Psychiatric: She has a normal mood and affect.  Nursing note and vitals reviewed.   BP 111/68 (BP Location: Left Arm, Patient Position: Sitting, Cuff Size: Normal)   Pulse 71   Temp 98.4 F (36.9 C) (Oral)   Resp 18   Ht 5\' 1"  (1.549 m)   Wt 105 lb 12.8 oz (48 kg)   LMP 01/14/2011   SpO2 99%   BMI 19.99 kg/m  Wt Readings from Last 3 Encounters:  01/16/17 105 lb 12.8 oz (48 kg)  01/14/17 104 lb 3.2 oz (47.3 kg)  10/30/16 104 lb (47.2 kg)  BP Readings from Last 3 Encounters:  01/16/17 111/68  01/14/17 115/70  10/30/16 104/60     Immunization History  Administered Date(s) Administered  . Influenza Whole 01/17/2010  . Influenza-Unspecified 11/18/2014  . Tdap 10/16/2016    Health Maintenance  Topic Date Due  . Hepatitis C Screening  1965-11-17  . PNEUMOCOCCAL POLYSACCHARIDE VACCINE (1) 03/13/1965  . FOOT EXAM  03/13/1973  . OPHTHALMOLOGY EXAM  03/13/1973  . URINE MICROALBUMIN  03/13/1973  . HIV Screening  03/13/1978  . MAMMOGRAM  10/29/2016  . HEMOGLOBIN A1C  07/16/2017  . PAP SMEAR  10/31/2019  . COLONOSCOPY  03/03/2024  . TETANUS/TDAP  10/17/2026  . INFLUENZA VACCINE  Completed    Lab Results  Component Value Date   WBC 10.3 01/16/2017   HGB 13.5 01/16/2017   HCT 40.8 01/16/2017   PLT 277.0 01/16/2017   GLUCOSE 90 01/16/2017   CHOL 195 01/16/2017   TRIG 66.0 01/16/2017   HDL 65.30 01/16/2017   LDLCALC 117 (H) 01/16/2017   ALT 9 01/16/2017   AST 14 01/16/2017   NA 138 01/16/2017   K 4.4 01/16/2017   CL 102 01/16/2017   CREATININE 0.79 01/16/2017   BUN 19 01/16/2017   CO2 29 01/16/2017   HGBA1C 5.7 01/16/2017    No results found for: TSH Lab Results  Component Value Date   WBC 10.3 01/16/2017   HGB 13.5 01/16/2017   HCT 40.8 01/16/2017   MCV 89.6 01/16/2017   PLT 277.0 01/16/2017   Lab Results  Component Value Date   NA 138 01/16/2017   K 4.4 01/16/2017   CO2 29 01/16/2017   GLUCOSE 90 01/16/2017   BUN 19  01/16/2017   CREATININE 0.79 01/16/2017   BILITOT 0.6 01/16/2017   ALKPHOS 60 01/16/2017   AST 14 01/16/2017   ALT 9 01/16/2017   PROT 7.2 01/16/2017   ALBUMIN 4.4 01/16/2017   CALCIUM 9.6 01/16/2017   GFR 80.65 01/16/2017   Lab Results  Component Value Date   CHOL 195 01/16/2017   Lab Results  Component Value Date   HDL 65.30 01/16/2017   Lab Results  Component Value Date   LDLCALC 117 (H) 01/16/2017   Lab Results  Component Value Date   TRIG 66.0 01/16/2017   Lab Results  Component Value Date   CHOLHDL 3 01/16/2017   Lab Results  Component Value Date   HGBA1C 5.7 01/16/2017         Assessment & Plan:   Problem List Items Addressed This Visit    Vitamin D deficiency    Check vitamin D today., endocrinology has recommended she stay on the high dose of Vitamin D.      Relevant Orders   CBC (Completed)   VITAMIN D 25 Hydroxy (Vit-D Deficiency, Fractures) (Completed)   MIXED HYPERLIPIDEMIA    Encouraged heart healthy diet, increase exercise, avoid trans fats, consider a krill oil cap daily      Relevant Orders   CBC (Completed)   Lipid panel (Completed)   Osteoporosis    On Actonel for past two years, previously on it for roughly a year.check Dexa scan ordered      Relevant Orders   CBC (Completed)   DG Bone Density   Hypothyroidism    On Levothyroxine, continue to monitor      Relevant Orders   CBC (Completed)   Diabetes type 2, controlled (HCC)    hgba1c acceptable, minimize simple carbs. Increase exercise as tolerated.  Relevant Orders   Hemoglobin A1c (Completed)   CBC (Completed)   Comprehensive metabolic panel (Completed)   Preventative health care    MM ordered today, patient declines pneumonia and is low risk.       Other Visit Diagnoses    Breast cancer screening    -  Primary   Relevant Orders   CBC (Completed)   MM DIAG BREAST TOMO BILATERAL      I am having Kelli Barry maintain her risedronate, ONE TOUCH ULTRA  TEST, levothyroxine, Vitamin D (Ergocalciferol), ALPRAZolam, estradiol, progesterone, and estradiol.  No orders of the defined types were placed in this encounter.   CMA served as Neurosurgeonscribe during this visit. History, Physical and Plan performed by medical provider. Documentation and orders reviewed and attested to.  Danise EdgeStacey Ansh Fauble, MD

## 2017-01-16 NOTE — Assessment & Plan Note (Signed)
On Levothyroxine, continue to monitor 

## 2017-01-19 ENCOUNTER — Other Ambulatory Visit: Payer: Self-pay | Admitting: Family Medicine

## 2017-01-19 DIAGNOSIS — Z1239 Encounter for other screening for malignant neoplasm of breast: Secondary | ICD-10-CM

## 2017-01-22 ENCOUNTER — Ambulatory Visit (INDEPENDENT_AMBULATORY_CARE_PROVIDER_SITE_OTHER)
Admission: RE | Admit: 2017-01-22 | Discharge: 2017-01-22 | Disposition: A | Discharge status: Home / Self Care | Payer: Self-pay | Source: Ambulatory Visit | Attending: Cardiovascular Disease | Admitting: Cardiovascular Disease

## 2017-01-22 DIAGNOSIS — E119 Type 2 diabetes mellitus without complications: Secondary | ICD-10-CM

## 2017-01-22 DIAGNOSIS — E782 Mixed hyperlipidemia: Secondary | ICD-10-CM

## 2017-02-13 ENCOUNTER — Ambulatory Visit (HOSPITAL_BASED_OUTPATIENT_CLINIC_OR_DEPARTMENT_OTHER)
Admission: RE | Admit: 2017-02-13 | Discharge: 2017-02-13 | Disposition: A | Discharge status: Home / Self Care | Payer: 59 | Source: Ambulatory Visit | Attending: Family Medicine | Admitting: Family Medicine

## 2017-02-13 DIAGNOSIS — M810 Age-related osteoporosis without current pathological fracture: Secondary | ICD-10-CM

## 2017-02-13 DIAGNOSIS — Z1231 Encounter for screening mammogram for malignant neoplasm of breast: Secondary | ICD-10-CM | POA: Diagnosis present

## 2017-02-13 DIAGNOSIS — Z1239 Encounter for other screening for malignant neoplasm of breast: Secondary | ICD-10-CM

## 2017-07-16 ENCOUNTER — Ambulatory Visit (INDEPENDENT_AMBULATORY_CARE_PROVIDER_SITE_OTHER): Payer: 59 | Admitting: Family Medicine

## 2017-07-16 DIAGNOSIS — E119 Type 2 diabetes mellitus without complications: Secondary | ICD-10-CM

## 2017-07-16 DIAGNOSIS — M810 Age-related osteoporosis without current pathological fracture: Secondary | ICD-10-CM | POA: Diagnosis not present

## 2017-07-16 DIAGNOSIS — E559 Vitamin D deficiency, unspecified: Secondary | ICD-10-CM

## 2017-07-16 DIAGNOSIS — E039 Hypothyroidism, unspecified: Secondary | ICD-10-CM

## 2017-07-16 MED ORDER — RISEDRONATE SODIUM 35 MG PO TABS: 35.0000 mg | ORAL_TABLET | ORAL | 8 refills | Status: DC

## 2017-07-16 MED ORDER — RISEDRONATE SODIUM 150 MG PO TABS
150.0000 mg | ORAL_TABLET | ORAL | 3 refills | Status: DC
Start: 2017-07-16 — End: 2018-03-05

## 2017-07-16 NOTE — Progress Notes (Signed)
Subjective:  I acted as a Neurosurgeon for Dr. Abner Greenspan. [name], [place]  Patient ID: Kelli Barry, female    DOB: 09/08/1965, 52 y.o.   MRN: [id number]  No chief complaint on file.   HPI  Patient is in today for a 6 month follow up and overall she is doing well. Recent Dexa scan confirmed persistent osteoporosis. She has taken actonel in the past and tolerated it well but has been off for several years. No recent febrile illness or hospitalization. She exercises regularly and stays active. Maintains a heart healthy diet and tries to get in her 3 servings of calcium daily. Denies CP/palp/SOB/HA/congestion/fevers/GI or GU c/o. Taking meds as prescribed  Patient Care Team: Bradd Canary, MD as PCP - General Juanda Chance Hedwig Morton, MD (Inactive) as Consulting Physician (Gastroenterology) Genia Del, MD as Consulting Physician (Obstetrics and Gynecology) Dorisann Frames, MD as Consulting Physician (Endocrinology) Elmon Else, MD as Consulting Physician (Dermatology)   Past Medical History:  Diagnosis Date  . Abdominal pain 08/06/2014  . Acute bronchitis 12/15/2010  . Allergy    seasonal  . Anxiety 10/16/2016  . Diabetes mellitus without complication (HCC)    diet controlled  . Diabetes type 2, controlled (HCC) 08/06/2014   Follows with Dr Talmage Nap  . Hyperlipidemia   . Hypothyroidism 08/06/2014  . Osteoporosis   . Osteoporosis 12/05/2009   Qualifier: Diagnosis of  By: Abner Greenspan MD, Misty Stanley    . Preventative health care 08/06/2014  . Rash 10/16/2016    Past Surgical History:  Procedure Laterality Date  . NO PAST SURGERIES      Family History  Problem Relation Age of Onset  . Diabetes Father        DM II  . Hypertension Father   . Melanoma Father   . Dementia Father   . Memory loss Father   . Hypertension Mother   . Fibroids Mother        uterine  . Hyperlipidemia Mother   . Stroke Mother   . Atrial fibrillation Mother   . Cancer Maternal Grandmother        colon  . Coronary  artery disease Maternal Grandmother   . Heart attack Maternal Grandmother   . Colon cancer Maternal Grandmother 75  . Heart disease Maternal Grandfather        MI at roughly 70  . Stroke Paternal Grandmother   . Dementia Paternal Grandmother   . Cancer Paternal Grandfather        Lung  . Emphysema Paternal Grandfather   . Ovarian cysts Sister   . Heart disease Maternal Aunt 72       MI  . Heart disease Maternal Uncle 107       ish  . Heart disease Maternal Uncle 44       ish, MI  . Allergies Son     Social History   Socioeconomic History  . Marital status: Married    Spouse name: Not on file  . Number of children: Not on file  . Years of education: Not on file  . Highest education level: Not on file  Occupational History  . Not on file  Social Needs  . Financial resource strain: Not on file  . Food insecurity:    Worry: Not on file    Inability: Not on file  . Transportation needs:    Medical: Not on file    Non-medical: Not on file  Tobacco Use  . Smoking status: Never Smoker  . Smokeless  tobacco: Never Used  Substance and Sexual Activity  . Alcohol use: Yes    Alcohol/week: 1.2 oz    Types: 2 Glasses of wine per week  . Drug use: No  . 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 Physical activity:    Days per week: Not on file    Minutes per session: Not on file  . Stress: Not on file  Relationships  . Social connections:    Talks on phone: Not on file    Gets together: Not on file    Attends religious service: Not on file    Active member of club or organization: Not on file    Attends meetings of clubs or organizations: Not on file    Relationship status: Not on file  . Intimate partner violence:    Fear of current or ex partner: Not on file    Emotionally abused: Not on file     Physically abused: Not on file    Forced sexual activity: Not on file  Other Topics Concern  . Not on file  Social History Narrative  . Not on file    Outpatient Medications Prior to Visit  Medication Sig Dispense Refill  . ALPRAZolam (XANAX) 0.25 MG tablet Take 1 tablet (0.25 mg total) by mouth 2 (two) times daily as needed for anxiety. 30 tablet 0  . estradiol (ESTRACE VAGINAL) 0.1 MG/GM vaginal cream Place 0.25 Applicatorfuls vaginally 2 (two) times a week. Apply a thin layer on vulva 2 times a week as needed. 42.5 g 4  . estradiol (VIVELLE-DOT) 0.05 MG/24HR patch Place 1 patch (0.05 mg total) onto the skin 2 (two) times a week. 24 patch 4  . levothyroxine (SYNTHROID, LEVOTHROID) 75 MCG tablet Take 75 mcg by mouth daily before breakfast.    . ONE TOUCH ULTRA TEST test strip 1 strip.  4  . progesterone (PROMETRIUM) 100 MG capsule Take 1 capsule (100 mg total) by mouth at bedtime. 90 capsule 4  . Vitamin D, Ergocalciferol, (DRISDOL) 50000 units CAPS capsule Take 50,000 Units by mouth every 7 (seven) days.    . risedronate (ACTONEL) 35 MG tablet Take 35 mg by mouth once a week. osteoporosis  8   No facility-administered medications prior to visit.     Allergies  Allergen Reactions  . Codeine   . Penicillins     Review of Systems  Constitutional: Negative for fever and malaise/fatigue.  HENT: Negative for congestion.   Eyes: Negative for blurred vision.  Respiratory: Negative for shortness of breath.   Cardiovascular: Negative for chest pain, palpitations and leg swelling.  Gastrointestinal: Negative for abdominal pain, blood in stool and nausea.  Genitourinary: Negative for dysuria and frequency.  Musculoskeletal: Negative for falls.  Skin: Negative for rash.  Neurological: Negative for dizziness, loss of consciousness and headaches.  Endo/Heme/Allergies: Negative for environmental allergies.  Psychiatric/Behavioral: Negative for depression. The patient is not nervous/anxious.         Objective:    Physical Exam  Constitutional: No distress.  HENT:  Left Ear: External ear normal.  Mouth/Throat: No oropharyngeal exudate.  Eyes: EOM are normal. Left eye exhibits no discharge. No scleral icterus.  Neck: No JVD present. No tracheal deviation present.  Cardiovascular: Normal heart sounds and intact distal pulses.  Pulmonary/Chest: No respiratory distress. She has no rales.  Abdominal:  She exhibits no distension and no mass. There is no tenderness. There is no guarding.  Musculoskeletal: She exhibits no edema, tenderness or deformity.  Lymphadenopathy:    She has no cervical adenopathy.  Skin: No rash noted. No erythema.    BP 90/62 (BP Location: Left Arm, Patient Position: Sitting, Cuff Size: Normal)   Pulse 85   Temp 97.9 F (36.6 C) (Oral)   Resp 18   Ht  (1.549 m)   Wt 105 lb 6.4 oz (47.8 kg)   LMP 01/14/2011   SpO2 99%   BMI 19.92 kg/m  Wt Readings from Last 3 Encounters:  07/16/17 105 lb 6.4 oz (47.8 kg)  01/16/17 105 lb 12.8 oz (48 kg)  01/14/17 104 lb 3.2 oz (47.3 kg)   BP Readings from Last 3 Encounters:  07/16/17 90/62  01/16/17 111/68  01/14/17 115/70     Immunization History  Administered Date(s) Administered  . Influenza Whole 01/17/2010  . Influenza-Unspecified 11/18/2014  . Tdap 10/16/2016    Health Maintenance  Topic Date Due  . Hepatitis C Screening  Oct 01, 1965  . PNEUMOCOCCAL POLYSACCHARIDE VACCINE (1) 03/13/1965  . FOOT EXAM  03/13/1973  . OPHTHALMOLOGY EXAM  03/13/1973  . URINE MICROALBUMIN  03/13/1973  . HIV Screening  03/13/1978  . HEMOGLOBIN A1C  07/16/2017  . INFLUENZA VACCINE  09/17/2017  . MAMMOGRAM  02/14/2019  . PAP SMEAR  10/31/2019  . COLONOSCOPY  03/03/2024  . TETANUS/TDAP  10/17/2026    Lab Results  Component Value Date   WBC 10.3 01/16/2017   HGB 13.5 01/16/2017   HCT 40.8 01/16/2017   PLT 277.0 01/16/2017   GLUCOSE 90 01/16/2017   CHOL 195 01/16/2017   TRIG 66.0 01/16/2017   HDL  65.30 01/16/2017   LDLCALC 117 (H) 01/16/2017   ALT 9 01/16/2017   AST 14 01/16/2017   NA 138 01/16/2017   K 4.4 01/16/2017   CL 102 01/16/2017   CREATININE 0.79 01/16/2017   BUN 19 01/16/2017   CO2 29 01/16/2017   HGBA1C 5.7 01/16/2017    No results found for: TSH Lab Results  Component Value Date   WBC 10.3 01/16/2017   HGB 13.5 01/16/2017   HCT 40.8 01/16/2017   MCV 89.6 01/16/2017   PLT 277.0 01/16/2017   Lab Results  Component Value Date   NA 138 01/16/2017   K 4.4 01/16/2017   CO2 29 01/16/2017   GLUCOSE 90 01/16/2017   BUN 19 01/16/2017   CREATININE 0.79 01/16/2017   BILITOT 0.6 01/16/2017   ALKPHOS 60 01/16/2017   AST 14 01/16/2017   ALT 9 01/16/2017   PROT 7.2 01/16/2017   ALBUMIN 4.4 01/16/2017   CALCIUM 9.6 01/16/2017   GFR 80.65 01/16/2017   Lab Results  Component Value Date   CHOL 195 01/16/2017   Lab Results  Component Value Date   HDL 65.30 01/16/2017   Lab Results  Component Value Date   LDLCALC 117 (H) 01/16/2017   Lab Results  Component Value Date   TRIG 66.0 01/16/2017   Lab Results  Component Value Date   CHOLHDL 3 01/16/2017   Lab Results  Component Value Date   HGBA1C 5.7 01/16/2017         Assessment & Plan:   Problem List Items Addressed This Visit    Vitamin D deficiency    Is now on 50000 Iu weekly prescribed by her endocrinologist.       Osteoporosis    Has tolerated Actonel  well in the past but has been off for years. She took the weekly dosing in the past will try prescribing the monthly dose and see if er insurance will cover. If not will switch back to weekly. Encouraged to get adequate exercise, calcium and vitamin d intake      Relevant Medications   risedronate (ACTONEL) 35 MG tablet   risedronate (ACTONEL) 150 MG tablet   Hypothyroidism    On Levothyroxine, continue to monitor      Diabetes type 2, controlled (HCC)    hgba1c acceptable, minimize simple carbs. Increase exercise as tolerated.            I have changed Darcie Papandrea's risedronate. I am also having her start on risedronate. Additionally, I am having her maintain her ONE TOUCH ULTRA TEST, levothyroxine, Vitamin D (Ergocalciferol), ALPRAZolam, estradiol, progesterone, and estradiol.  Meds ordered this encounter  Medications  . risedronate (ACTONEL) 35 MG tablet    Sig: Take 1 tablet (35 mg total) by mouth once a week. osteoporosis    Dispense:  4 tablet    Refill:  8  . risedronate (ACTONEL) 150 MG tablet    Sig: Take 1 tablet (150 mg total) by mouth every 30 (thirty) days. with water on empty stomach, nothing by mouth or lie down for next 30 minutes.    Dispense:  3 tablet    Refill:  3    CMA served as scribe during this visit. History, Physical and Plan performed by medical provider. Documentation and orders reviewed and attested to.  Danise Edge, MD

## 2017-07-16 NOTE — Patient Instructions (Signed)
[facility] CoverMyMeds Osteoporosis Osteoporosis is the thinning and loss of density in the bones. Osteoporosis makes the bones more brittle, fragile, and likely to break (fracture). Over time, osteoporosis can cause the bones to become so weak that they fracture after a simple fall. The bones most likely to fracture are the bones in the hip, wrist, and spine. What are the causes? The exact cause is not known. What increases the risk? Anyone can develop osteoporosis. You may be at greater risk if you have a family history of the condition or have poor nutrition. You may also have a higher risk if you are:  Female.  75 years old or older.  A smoker.  Not physically active.  White or Asian.  Slender.  What are the signs or symptoms? A fracture might be the first sign of the disease, especially if it results from a fall or injury that would not usually cause a bone to break. Other signs and symptoms include:  Low back and neck pain.  Stooped posture.  Height loss.  How is this diagnosed? To make a diagnosis, your health care provider may:  Take a medical history.  Perform a physical exam.  Order tests, such as: ? A bone mineral density test. ? A dual-energy X-ray absorptiometry test.  How is this treated? The goal of osteoporosis treatment is to strengthen your bones to reduce your risk of a fracture. Treatment may involve:  Making lifestyle changes, such as: ? Eating a diet rich in calcium. ? Doing weight-bearing and muscle-strengthening exercises. ? Stopping tobacco use. ? Limiting alcohol intake.  Taking medicine to slow the process of bone loss or to increase bone density.  Monitoring your levels of calcium and vitamin D.  Follow these instructions at home:  Include calcium and vitamin D in your diet. Calcium is important for bone health, and vitamin D helps the body absorb calcium.  Perform weight-bearing and muscle-strengthening exercises as directed by your  health care provider.  Do not use any tobacco products, including cigarettes, chewing tobacco, and electronic cigarettes. If you need help quitting, ask your health care provider.  Limit your alcohol intake.  Take medicines only as directed by your health care provider.  Keep all follow-up visits as directed by your health care provider. This is important.  Take precautions at home to lower your risk of falling, such as: ? Keeping rooms well lit and clutter free. ? Installing safety rails on stairs. ? Using rubber mats in the bathroom and other areas that are often wet or slippery. Get help right away if: You fall or injure yourself. This information is not intended to replace advice given to you by your health care provider. Make sure you discuss any questions you have with your health care provider. Document Released: 11/13/2004 Document Revised: 07/09/2015 Document Reviewed: 07/14/2013 Elsevier Interactive Patient Education  [employer].

## 2017-07-19 NOTE — Assessment & Plan Note (Signed)
Has tolerated Actonel well in the past but has been off for years. She took the weekly dosing in the past will try prescribing the monthly dose and see if er insurance will cover. If not will switch back to weekly. Encouraged to get adequate exercise, calcium and vitamin d intake

## 2017-07-19 NOTE — Assessment & Plan Note (Signed)
Is now on 50000 Iu weekly prescribed by her endocrinologist.

## 2017-07-19 NOTE — Assessment & Plan Note (Signed)
hgba1c acceptable, minimize simple carbs. Increase exercise as tolerated.  

## 2017-07-19 NOTE — Assessment & Plan Note (Signed)
On Levothyroxine, continue to monitor 

## 2017-11-02 ENCOUNTER — Encounter: Payer: 59 | Admitting: Obstetrics & Gynecology

## 2017-11-16 ENCOUNTER — Encounter: Payer: 59 | Admitting: Obstetrics & Gynecology

## 2017-11-29 ENCOUNTER — Other Ambulatory Visit: Payer: Self-pay | Admitting: Obstetrics & Gynecology

## 2017-11-30 NOTE — Telephone Encounter (Signed)
Annual scheduled on 01/27/18 

## 2017-12-01 ENCOUNTER — Other Ambulatory Visit: Payer: Self-pay | Admitting: Obstetrics & Gynecology

## 2017-12-01 NOTE — Telephone Encounter (Signed)
Annual exam scheduled on 01/27/18

## 2017-12-18 ENCOUNTER — Telehealth: Payer: Self-pay | Admitting: *Deleted

## 2017-12-18 NOTE — Telephone Encounter (Signed)
Patient called and left message to call her about Rx and other questions. I called and received voicemail, I asked her to call me

## 2018-01-18 ENCOUNTER — Encounter: Payer: 59 | Admitting: Family Medicine

## 2018-01-27 ENCOUNTER — Encounter: Payer: 59 | Admitting: Obstetrics & Gynecology

## 2018-02-23 ENCOUNTER — Encounter: Payer: 59 | Admitting: Family Medicine

## 2018-02-26 ENCOUNTER — Other Ambulatory Visit (HOSPITAL_BASED_OUTPATIENT_CLINIC_OR_DEPARTMENT_OTHER): Payer: Self-pay | Admitting: Family Medicine

## 2018-02-26 DIAGNOSIS — Z1231 Encounter for screening mammogram for malignant neoplasm of breast: Secondary | ICD-10-CM

## 2018-03-05 ENCOUNTER — Ambulatory Visit (INDEPENDENT_AMBULATORY_CARE_PROVIDER_SITE_OTHER): Payer: 59 | Admitting: Family Medicine

## 2018-03-05 ENCOUNTER — Encounter: Payer: Self-pay | Admitting: Family Medicine

## 2018-03-05 ENCOUNTER — Ambulatory Visit (HOSPITAL_BASED_OUTPATIENT_CLINIC_OR_DEPARTMENT_OTHER)
Admission: RE | Admit: 2018-03-05 | Discharge: 2018-03-05 | Disposition: A | Discharge status: Home / Self Care | Payer: 59 | Source: Ambulatory Visit | Attending: Family Medicine | Admitting: Family Medicine

## 2018-03-05 VITALS — BP 98/70 | HR 66 | Temp 97.9°F | Resp 18 | Ht 61.0 in | Wt 105.2 lb

## 2018-03-05 DIAGNOSIS — E039 Hypothyroidism, unspecified: Secondary | ICD-10-CM | POA: Diagnosis not present

## 2018-03-05 DIAGNOSIS — Z0000 Encounter for general adult medical examination without abnormal findings: Secondary | ICD-10-CM | POA: Diagnosis not present

## 2018-03-05 DIAGNOSIS — E782 Mixed hyperlipidemia: Secondary | ICD-10-CM

## 2018-03-05 DIAGNOSIS — E119 Type 2 diabetes mellitus without complications: Secondary | ICD-10-CM

## 2018-03-05 DIAGNOSIS — Z79899 Other long term (current) drug therapy: Secondary | ICD-10-CM | POA: Diagnosis not present

## 2018-03-05 DIAGNOSIS — E559 Vitamin D deficiency, unspecified: Secondary | ICD-10-CM

## 2018-03-05 DIAGNOSIS — M810 Age-related osteoporosis without current pathological fracture: Secondary | ICD-10-CM

## 2018-03-05 DIAGNOSIS — Z1231 Encounter for screening mammogram for malignant neoplasm of breast: Secondary | ICD-10-CM | POA: Diagnosis present

## 2018-03-05 MED ORDER — RISEDRONATE SODIUM 35 MG PO TABS: 35.0000 mg | ORAL_TABLET | ORAL | 11 refills | Status: DC

## 2018-03-05 MED ORDER — ALPRAZOLAM 0.25 MG PO TABS: 0.2500 mg | ORAL_TABLET | Freq: Two times a day (BID) | ORAL | 1 refills | Status: DC | PRN

## 2018-03-05 NOTE — Assessment & Plan Note (Signed)
Encouraged heart healthy diet, increase exercise, avoid trans fats, consider a krill oil cap daily 

## 2018-03-05 NOTE — Patient Instructions (Addendum)
Shingrix is the new shingles vaccine, 2 shots over 2-6 months. You can get it at the pharmacy.  Preventive Care 40-64 Years, Female Preventive care refers to lifestyle choices and visits with your health care provider that can promote health and wellness. What does preventive care include?   A yearly physical exam. This is also called an annual well check.  Dental exams once or twice a year.  Routine eye exams. Ask your health care provider how often you should have your eyes checked.  Personal lifestyle choices, including: ? Daily care of your teeth and gums. ? Regular physical activity. ? Eating a healthy diet. ? Avoiding tobacco and drug use. ? Limiting alcohol use. ? Practicing safe sex. ? Taking low-dose aspirin daily starting at age 32. ? Taking vitamin and mineral supplements as recommended by your health care provider. What happens during an annual well check? The services and screenings done by your health care provider during your annual well check will depend on your age, overall health, lifestyle risk factors, and family history of disease. Counseling Your health care provider may ask you questions about your:  Alcohol use.  Tobacco use.  Drug use.  Emotional well-being.  Home and relationship well-being.  Sexual activity.  Eating habits.  Work and work Statistician.  Method of birth control.  Menstrual cycle.  Pregnancy history. Screening You may have the following tests or measurements:  Height, weight, and BMI.  Blood pressure.  Lipid and cholesterol levels. These may be checked every 5 years, or more frequently if you are over 72 years old.  Skin check.  Lung cancer screening. You may have this screening every year starting at age 80 if you have a 30-pack-year history of smoking and currently smoke or have quit within the past 15 years.  Colorectal cancer screening. All adults should have this screening starting at age 25 and continuing  until age 52. Your health care provider may recommend screening at age 73. You will have tests every 1-10 years, depending on your results and the type of screening test. People at increased risk should start screening at an earlier age. Screening tests may include: ? Guaiac-based fecal occult blood testing. ? Fecal immunochemical test (FIT). ? Stool DNA test. ? Virtual colonoscopy. ? Sigmoidoscopy. During this test, a flexible tube with a tiny camera (sigmoidoscope) is used to examine your rectum and lower colon. The sigmoidoscope is inserted through your anus into your rectum and lower colon. ? Colonoscopy. During this test, a long, thin, flexible tube with a tiny camera (colonoscope) is used to examine your entire colon and rectum.  Hepatitis C blood test.  Hepatitis B blood test.  Sexually transmitted disease (STD) testing.  Diabetes screening. This is done by checking your blood sugar (glucose) after you have not eaten for a while (fasting). You may have this done every 1-3 years.  Mammogram. This may be done every 1-2 years. Talk to your health care provider about when you should start having regular mammograms. This may depend on whether you have a family history of breast cancer.  BRCA-related cancer screening. This may be done if you have a family history of breast, ovarian, tubal, or peritoneal cancers.  Pelvic exam and Pap test. This may be done every 3 years starting at age 1. Starting at age 66, this may be done every 5 years if you have a Pap test in combination with an HPV test.  Bone density scan. This is done to screen for osteoporosis.  You may have this scan if you are at high risk for osteoporosis. Discuss your test results, treatment options, and if necessary, the need for more tests with your health care provider. Vaccines Your health care provider may recommend certain vaccines, such as:  Influenza vaccine. This is recommended every year.  Tetanus, diphtheria, and  acellular pertussis (Tdap, Td) vaccine. You may need a Td booster every 10 years.  Varicella vaccine. You may need this if you have not been vaccinated.  Zoster vaccine. You may need this after age 2.  Measles, mumps, and rubella (MMR) vaccine. You may need at least one dose of MMR if you were born in 1957 or later. You may also need a second dose.  Pneumococcal 13-valent conjugate (PCV13) vaccine. You may need this if you have certain conditions and were not previously vaccinated.  Pneumococcal polysaccharide (PPSV23) vaccine. You may need one or two doses if you smoke cigarettes or if you have certain conditions.  Meningococcal vaccine. You may need this if you have certain conditions.  Hepatitis A vaccine. You may need this if you have certain conditions or if you travel or work in places where you may be exposed to hepatitis A.  Hepatitis B vaccine. You may need this if you have certain conditions or if you travel or work in places where you may be exposed to hepatitis B.  Haemophilus influenzae type b (Hib) vaccine. You may need this if you have certain conditions. Talk to your health care provider about which screenings and vaccines you need and how often you need them. This information is not intended to replace advice given to you by your health care provider. Make sure you discuss any questions you have with your health care provider. Document Released: 03/02/2015 Document Revised: 03/26/2017 Document Reviewed: 12/05/2014 Elsevier Interactive Patient Education  2019 [employer].

## 2018-03-05 NOTE — Assessment & Plan Note (Signed)
Encouraged to get adequate exercise, calcium and vitamin d intake 

## 2018-03-05 NOTE — Assessment & Plan Note (Signed)
hgba1c acceptable, minimize simple carbs. Increase exercise as tolerated.  

## 2018-03-05 NOTE — Assessment & Plan Note (Addendum)
Supplement and monitor, she believes this is checked by endocrinology

## 2018-03-05 NOTE — Assessment & Plan Note (Addendum)
On Levothyroxine, continue to monitor follows with endocrinology, Dr Talmage Nap will sign release of records.

## 2018-03-05 NOTE — Progress Notes (Signed)
Subjective:    Patient ID: Kelli Barry, female    DOB: [birth year], 53 y.o.   MRN: [id number]  No chief complaint on file.   HPI  Patient is in today for annual physical and wellness exam. She is doing well overall, and doesn't have complaints other than some insomnia from life stress lately. About 2 nights/wk she either has trouble falling asleep or staying asleep. Pt is recently separated from her husband and she is taking care of her father who's health is declining. She has been exercising regularly and watching her diet well.  Patient Care Team: Bradd Canary, MD as PCP - General Juanda Chance Hedwig Morton, MD (Inactive) as Consulting Physician (Gastroenterology) Genia Del, MD as Consulting Physician (Obstetrics and Gynecology) Dorisann Frames, MD as Consulting Physician (Endocrinology) Elmon Else, MD as Consulting Physician (Dermatology)   Past Medical History:  Diagnosis Date  . Abdominal pain 08/06/2014  . Acute bronchitis 12/15/2010  . Allergy    seasonal  . Anxiety 10/16/2016  . Diabetes mellitus without complication (HCC)    diet controlled  . Diabetes type 2, controlled (HCC) 08/06/2014   Follows with Dr Talmage Nap  . Hyperlipidemia   . Hypothyroidism 08/06/2014  . Osteoporosis   . Osteoporosis 12/05/2009   Qualifier: Diagnosis of  By: Abner Greenspan MD, Misty Stanley    . Preventative health care 08/06/2014  . Rash 10/16/2016    Past Surgical History:  Procedure Laterality Date  . NO PAST SURGERIES      Family History  Problem Relation Age of Onset  . Diabetes Father        DM II  . Hypertension Father   . Melanoma Father   . Dementia Father   . Memory loss Father   . Parkinsonism Father   . Hypertension Mother   . Fibroids Mother        uterine  . Hyperlipidemia Mother   . Stroke Mother   . Atrial fibrillation Mother   . Cancer Maternal Grandmother        colon  . Coronary artery disease Maternal Grandmother   . Heart attack Maternal Grandmother   . Colon cancer  Maternal Grandmother 75  . Heart disease Maternal Grandfather        MI at roughly 52  . Stroke Paternal Grandmother   . Dementia Paternal Grandmother   . Cancer Paternal Grandfather        Lung  . Emphysema Paternal Grandfather   . Ovarian cysts Sister   . Heart disease Maternal Aunt 72       MI  . Heart disease Maternal Uncle 50       ish  . Heart disease Maternal Uncle 73       ish, MI  . Allergies Son     Social History   Socioeconomic History  . Marital status: Married    Spouse name: Not on file  . Number of children: Not on file  . Years of education: Not on file  . Highest education level: Not on file  Occupational History  . Not on file  Social Needs  . Financial resource strain: Not on file  . Food insecurity:    Worry: Not on file    Inability: Not on file  . Transportation needs:    Medical: Not on file    Non-medical: Not on file  Tobacco Use  . Smoking status: Never Smoker  . Smokeless tobacco: Never Used  Substance and Sexual Activity  . Alcohol use: Yes  Alcohol/week: 2.0 standard drinks    Types: 2 Glasses of wine per week  . Drug use: No  . 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 Physical activity:    Days per week: Not on file    Minutes per session: Not on file  . Stress: Not on file  Relationships  . Social connections:    Talks on phone: Not on file    Gets together: Not on file    Attends religious service: Not on file    Active member of club or organization: Not on file    Attends meetings of clubs or organizations: Not on file    Relationship status: Not on file  . Intimate partner violence:    Fear of current or ex partner: Not on file    Emotionally abused: Not on file    Physically abused: Not on file    Forced sexual activity: Not on file  Other Topics  Concern  . Not on file  Social History Narrative  . Not on file    Outpatient Medications Prior to Visit  Medication Sig Dispense Refill  . estradiol (ESTRACE VAGINAL) 0.1 MG/GM vaginal cream Place 0.25 Applicatorfuls vaginally 2 (two) times a week. Apply a thin layer on vulva 2 times a week as needed. 42.5 g 4  . estradiol (VIVELLE-DOT) 0.05 MG/24HR patch PLACE 1 PATCH (0.05 MG TOTAL) ONTO THE SKIN 2 (TWO) TIMES A WEEK. 8 patch 1  . levothyroxine (SYNTHROID, LEVOTHROID) 75 MCG tablet Take 75 mcg by mouth daily before breakfast.    . ONE TOUCH ULTRA TEST test strip 1 strip.  4  . progesterone (PROMETRIUM) 100 MG capsule TAKE 1 CAPSULE (100 MG TOTAL) BY MOUTH AT BEDTIME. 90 capsule 0  . Vitamin D, Ergocalciferol, (DRISDOL) 50000 units CAPS capsule Take 50,000 Units by mouth every 7 (seven) days.    . ALPRAZolam (XANAX) 0.25 MG tablet Take 1 tablet (0.25 mg total) by mouth 2 (two) times daily as needed for anxiety. 30 tablet 0  . risedronate (ACTONEL) 150 MG tablet Take 1 tablet (150 mg total) by mouth every 30 (thirty) days. with water on empty stomach, nothing by mouth or lie down for next 30 minutes. 3 tablet 3  . risedronate (ACTONEL) 35 MG tablet Take 1 tablet (35 mg total) by mouth once a week. osteoporosis 4 tablet 8   No facility-administered medications prior to visit.     Allergies  Allergen Reactions  . Codeine   . Penicillins     Review of Systems  Constitutional: Negative for chills, diaphoresis, fever, malaise/fatigue and weight loss.  HENT: Negative for ear pain, hearing loss and sore throat.   Eyes: Negative for blurred vision, double vision and pain.  Respiratory: Negative for shortness of breath.   Cardiovascular: Negative for chest pain and palpitations.  Gastrointestinal: Negative for abdominal pain, constipation and diarrhea.  Genitourinary: Negative for dysuria, frequency and urgency.  Skin: Negative for rash.  Psychiatric/Behavioral: The patient has insomnia.         Objective:     Kelli Barry appears WDWN and in no acute distress.  Physical Exam Constitutional:      General: She is not in acute distress.    Appearance: Normal appearance. She is normal weight.  HENT:     Head: Normocephalic and atraumatic.  Right Ear: Tympanic membrane normal.     Left Ear: Tympanic membrane normal.     Nose: Nose normal.     Mouth/Throat:     Mouth: Mucous membranes are moist.     Pharynx: Oropharynx is clear.  Eyes:     Extraocular Movements: Extraocular movements intact.     Pupils: Pupils are equal, round, and reactive to light.  Neck:     Musculoskeletal: Neck supple.  Cardiovascular:     Rate and Rhythm: Normal rate and regular rhythm.     Pulses: Normal pulses.     Heart sounds: Normal heart sounds.  Pulmonary:     Effort: Pulmonary effort is normal.     Breath sounds: Normal breath sounds.  Abdominal:     General: Abdomen is flat. Bowel sounds are normal.     Palpations: Abdomen is soft.  Neurological:     General: No focal deficit present.     Mental Status: She is alert and oriented to person, place, and time.  Psychiatric:        Mood and Affect: Mood normal.        Behavior: Behavior normal.     BP 98/70 (BP Location: Left Arm, Patient Position: Sitting, Cuff Size: Normal)   Pulse 66   Temp 97.9 F (36.6 C) (Oral)   Resp 18   Ht 5\' 1"  (1.549 m)   Wt 47.7 kg   LMP 01/14/2011   SpO2 99%   BMI 19.88 kg/m  Wt Readings from Last 3 Encounters:  03/05/18 47.7 kg  07/16/17 47.8 kg  01/16/17 48 kg   BP Readings from Last 3 Encounters:  03/05/18 98/70  07/16/17 90/62  01/16/17 111/68     Immunization History  Administered Date(s) Administered  . Influenza Whole 01/17/2010  . Influenza-Unspecified 11/18/2014  . Tdap 10/16/2016    Health Maintenance  Topic Date Due  . Hepatitis C Screening  08/13/1963  . PNEUMOCOCCAL POLYSACCHARIDE VACCINE AGE 28-64 HIGH RISK  03/13/1965  . FOOT EXAM  03/13/1973  .  OPHTHALMOLOGY EXAM  03/13/1973  . URINE MICROALBUMIN  03/13/1973  . HIV Screening  03/13/1978  . HEMOGLOBIN A1C  07/16/2017  . MAMMOGRAM  02/14/2019  . PAP SMEAR-Modifier  10/31/2019  . COLONOSCOPY  03/03/2024  . TETANUS/TDAP  10/17/2026  . INFLUENZA VACCINE  Completed    Lab Results  Component Value Date   WBC 10.3 01/16/2017   HGB 13.5 01/16/2017   HCT 40.8 01/16/2017   PLT 277.0 01/16/2017   GLUCOSE 90 01/16/2017   CHOL 195 01/16/2017   TRIG 66.0 01/16/2017   HDL 65.30 01/16/2017   LDLCALC 117 (H) 01/16/2017   ALT 9 01/16/2017   AST 14 01/16/2017   NA 138 01/16/2017   K 4.4 01/16/2017   CL 102 01/16/2017   CREATININE 0.79 01/16/2017   BUN 19 01/16/2017   CO2 29 01/16/2017   HGBA1C 5.7 01/16/2017    No results found for: TSH Lab Results  Component Value Date   WBC 10.3 01/16/2017   HGB 13.5 01/16/2017   HCT 40.8 01/16/2017   MCV 89.6 01/16/2017   PLT 277.0 01/16/2017   Lab Results  Component Value Date   NA 138 01/16/2017   K 4.4 01/16/2017   CO2 29 01/16/2017   GLUCOSE 90 01/16/2017   BUN 19 01/16/2017   CREATININE 0.79 01/16/2017   BILITOT 0.6 01/16/2017   ALKPHOS 60 01/16/2017   AST 14 01/16/2017   ALT 9 01/16/2017   PROT  7.2 01/16/2017   ALBUMIN 4.4 01/16/2017   CALCIUM 9.6 01/16/2017   GFR 80.65 01/16/2017   Lab Results  Component Value Date   CHOL 195 01/16/2017   Lab Results  Component Value Date   HDL 65.30 01/16/2017   Lab Results  Component Value Date   LDLCALC 117 (H) 01/16/2017   Lab Results  Component Value Date   TRIG 66.0 01/16/2017   Lab Results  Component Value Date   CHOLHDL 3 01/16/2017   Lab Results  Component Value Date   HGBA1C 5.7 01/16/2017         Assessment & Plan:   Problems Addressed This Visit  Hypothyroidism: Well controlled. Pt is still taking levothyroxine managed mainly by her endocrinologist, Dr. Talmage Nap. Keep monitoring TSH.  DM type 2: Well controlled with diet and exercise. Hemoglobin A1c  appropriate.  Osteoporosis: DEXA scan from 2018 showed osteoporosis. Continue risedronate and repeat scan at the end of the year. - Orders: Risedronate 35mg  tablet weekly  Vitamin D Deficiency: Labs from 2018 showed deficiency. Pt has been taking Vit D daily and Dr. Talmage Nap will be getting labs at their upcoming visit in two weeks.  Hyperlipidemia: Most recent labs showed high levels of LDL, but otherwise appropriate. Encouraged continued heart healthy diet and exercise. Will evaluate labs in the next couple weeks.  Preventative Care: Encouraged continued diet and increased exercise as tolerated. Pt is up to date on flu shot, mammogram, colonoscopy, PAP and DEXA scan as of today. Encouraged shingrix vaccine when the pt is ready.  I am having Kelli Barry maintain her ONE TOUCH ULTRA TEST, levothyroxine, Vitamin D (Ergocalciferol), estradiol, estradiol, progesterone, ALPRAZolam, and risedronate.  Meds ordered this encounter  Medications  . ALPRAZolam (XANAX) 0.25 MG tablet    Sig: Take 1 tablet (0.25 mg total) by mouth 2 (two) times daily as needed for anxiety.    Dispense:  30 tablet    Refill:  1  . risedronate (ACTONEL) 35 MG tablet    Sig: Take 1 tablet (35 mg total) by mouth once a week. osteoporosis    Dispense:  4 tablet    Refill:  [address]

## 2018-03-08 NOTE — Progress Notes (Signed)
Subjective:    Patient ID: Kelli Barry, female    DOB: 01/15/1966, 53 y.o.   MRN: [id number]  No chief complaint on file.   HPI Patient is in today for annual preventative exam. She is following with endocrinology Dr Talmage Nap and they monitor her thyroid, vitamin D and more. She feels well although she does acknowledge a good deal of stress as she has separated from her husband. No recent febrile illness or hospitalizations. She is trying to maintain a heart healthy diet and is staying active. Denies CP/palp/SOB/HA/congestion/fevers/GI or GU c/o. Taking meds as prescribed. She is managing her activities of daily living. Denies CP/palp/SOB/HA/congestion/fevers/GI or GU c/o. Taking meds as prescribed  Past Medical History:  Diagnosis Date  . Abdominal pain 08/06/2014  . Acute bronchitis 12/15/2010  . Allergy    seasonal  . Anxiety 10/16/2016  . Diabetes mellitus without complication (HCC)    diet controlled  . Diabetes type 2, controlled (HCC) 08/06/2014   Follows with Dr Talmage Nap  . Hyperlipidemia   . Hypothyroidism 08/06/2014  . Osteoporosis   . Osteoporosis 12/05/2009   Qualifier: Diagnosis of  By: Abner Greenspan MD, Misty Stanley    . Preventative health care 08/06/2014  . Rash 10/16/2016    Past Surgical History:  Procedure Laterality Date  . NO PAST SURGERIES      Family History  Problem Relation Age of Onset  . Diabetes Father        DM II  . Hypertension Father   . Melanoma Father   . Dementia Father   . Memory loss Father   . Parkinsonism Father   . Hypertension Mother   . Fibroids Mother        uterine  . Hyperlipidemia Mother   . Stroke Mother   . Atrial fibrillation Mother   . Cancer Maternal Grandmother        colon  . Coronary artery disease Maternal Grandmother   . Heart attack Maternal Grandmother   . Colon cancer Maternal Grandmother 75  . Heart disease Maternal Grandfather        MI at roughly 49  . Stroke Paternal Grandmother   . Dementia Paternal Grandmother   .  Cancer Paternal Grandfather        Lung  . Emphysema Paternal Grandfather   . Ovarian cysts Sister   . Heart disease Maternal Aunt 72       MI  . Heart disease Maternal Uncle 20       ish  . Heart disease Maternal Uncle 39       ish, MI  . Allergies Son     Social History   Socioeconomic History  . Marital status: Married    Spouse name: Not on file  . Number of children: Not on file  . Years of education: Not on file  . Highest education level: Not on file  Occupational History  . Not on file  Social Needs  . Financial resource strain: Not on file  . Food insecurity:    Worry: Not on file    Inability: Not on file  . Transportation needs:    Medical: Not on file    Non-medical: Not on file  Tobacco Use  . Smoking status: Never Smoker  . Smokeless tobacco: Never Used  Substance and Sexual Activity  . Alcohol use: Yes    Alcohol/week: 2.0 standard drinks    Types: 2 Glasses of wine per week  . Drug use: No  . 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 Physical activity:    Days per week: Not on file    Minutes per session: Not on file  . Stress: Not on file  Relationships  . Social connections:    Talks on phone: Not on file    Gets together: Not on file    Attends religious service: Not on file    Active member of club or organization: Not on file    Attends meetings of clubs or organizations: Not on file    Relationship status: Not on file  . Intimate partner violence:    Fear of current or ex partner: Not on file    Emotionally abused: Not on file    Physically abused: Not on file    Forced sexual activity: Not on file  Other Topics Concern  . Not on file  Social History Narrative  . Not on file    Outpatient Medications Prior to Visit  Medication Sig Dispense Refill  . estradiol  (ESTRACE VAGINAL) 0.1 MG/GM vaginal cream Place 0.25 Applicatorfuls vaginally 2 (two) times a week. Apply a thin layer on vulva 2 times a week as needed. 42.5 g 4  . estradiol (VIVELLE-DOT) 0.05 MG/24HR patch PLACE 1 PATCH (0.05 MG TOTAL) ONTO THE SKIN 2 (TWO) TIMES A WEEK. 8 patch 1  . levothyroxine (SYNTHROID, LEVOTHROID) 75 MCG tablet Take 75 mcg by mouth daily before breakfast.    . ONE TOUCH ULTRA TEST test strip 1 strip.  4  . progesterone (PROMETRIUM) 100 MG capsule TAKE 1 CAPSULE (100 MG TOTAL) BY MOUTH AT BEDTIME. 90 capsule 0  . Vitamin D, Ergocalciferol, (DRISDOL) 50000 units CAPS capsule Take 50,000 Units by mouth every 7 (seven) days.    . ALPRAZolam (XANAX) 0.25 MG tablet Take 1 tablet (0.25 mg total) by mouth 2 (two) times daily as needed for anxiety. 30 tablet 0  . risedronate (ACTONEL) 150 MG tablet Take 1 tablet (150 mg total) by mouth every 30 (thirty) days. with water on empty stomach, nothing by mouth or lie down for next 30 minutes. 3 tablet 3  . risedronate (ACTONEL) 35 MG tablet Take 1 tablet (35 mg total) by mouth once a week. osteoporosis 4 tablet 8   No facility-administered medications prior to visit.     Allergies  Allergen Reactions  . Codeine   . Penicillins     Review of Systems  Constitutional: Negative for fever and malaise/fatigue.  HENT: Negative for congestion, ear pain and hearing loss.   Eyes: Negative for blurred vision.  Respiratory: Negative for cough and shortness of breath.   Cardiovascular: Negative for chest pain, palpitations and leg swelling.  Gastrointestinal: Negative for abdominal pain, blood in stool and nausea.  Genitourinary: Negative for dysuria and frequency.  Musculoskeletal: Negative for falls and myalgias.  Skin: Negative for rash.  Neurological: Negative for dizziness, sensory change, loss of consciousness and headaches.  Endo/Heme/Allergies: Negative for environmental allergies.  Psychiatric/Behavioral: Negative for  depression. The patient is nervous/anxious. The patient does not have insomnia.        Objective:    Physical Exam Constitutional:      General: She is not in acute distress.    Appearance: She is not diaphoretic.  HENT:     Head: Normocephalic and atraumatic.     Right Ear: External ear normal.  Left Ear: External ear normal.     Nose: Nose normal.     Mouth/Throat:     Pharynx: No oropharyngeal exudate.  Eyes:     General: No scleral icterus.       Right eye: No discharge.        Left eye: No discharge.     Conjunctiva/sclera: Conjunctivae normal.     Pupils: Pupils are equal, round, and reactive to light.  Neck:     Musculoskeletal: Normal range of motion and neck supple.     Thyroid: No thyromegaly.  Cardiovascular:     Rate and Rhythm: Normal rate and regular rhythm.     Heart sounds: Normal heart sounds. No murmur.  Pulmonary:     Effort: Pulmonary effort is normal. No respiratory distress.     Breath sounds: Normal breath sounds. No wheezing or rales.  Abdominal:     General: Bowel sounds are normal. There is no distension.     Palpations: Abdomen is soft. There is no mass.     Tenderness: There is no abdominal tenderness.  Musculoskeletal: Normal range of motion.        General: No tenderness.  Lymphadenopathy:     Cervical: No cervical adenopathy.  Skin:    General: Skin is warm and dry.     Findings: No rash.  Neurological:     Mental Status: She is alert and oriented to person, place, and time.     Cranial Nerves: No cranial nerve deficit.     Coordination: Coordination normal.     Deep Tendon Reflexes: Reflexes are normal and symmetric. Reflexes normal.     BP 98/70 (BP Location: Left Arm, Patient Position: Sitting, Cuff Size: Normal)   Pulse 66   Temp 97.9 F (36.6 C) (Oral)   Resp 18   Ht 5\' 1"  (1.549 m)   Wt 105 lb 3.2 oz (47.7 kg)   LMP 01/14/2011   SpO2 99%   BMI 19.88 kg/m  Wt Readings from Last 3 Encounters:  03/05/18 105 lb 3.2 oz  (47.7 kg)  07/16/17 105 lb 6.4 oz (47.8 kg)  01/16/17 105 lb 12.8 oz (48 kg)     Lab Results  Component Value Date   WBC 10.3 01/16/2017   HGB 13.5 01/16/2017   HCT 40.8 01/16/2017   PLT 277.0 01/16/2017   GLUCOSE 90 01/16/2017   CHOL 195 01/16/2017   TRIG 66.0 01/16/2017   HDL 65.30 01/16/2017   LDLCALC 117 (H) 01/16/2017   ALT 9 01/16/2017   AST 14 01/16/2017   NA 138 01/16/2017   K 4.4 01/16/2017   CL 102 01/16/2017   CREATININE 0.79 01/16/2017   BUN 19 01/16/2017   CO2 29 01/16/2017   HGBA1C 5.7 01/16/2017    No results found for: TSH Lab Results  Component Value Date   WBC 10.3 01/16/2017   HGB 13.5 01/16/2017   HCT 40.8 01/16/2017   MCV 89.6 01/16/2017   PLT 277.0 01/16/2017   Lab Results  Component Value Date   NA 138 01/16/2017   K 4.4 01/16/2017   CO2 29 01/16/2017   GLUCOSE 90 01/16/2017   BUN 19 01/16/2017   CREATININE 0.79 01/16/2017   BILITOT 0.6 01/16/2017   ALKPHOS 60 01/16/2017   AST 14 01/16/2017   ALT 9 01/16/2017   PROT 7.2 01/16/2017   ALBUMIN 4.4 01/16/2017   CALCIUM 9.6 01/16/2017   GFR 80.65 01/16/2017   Lab Results  Component Value Date  CHOL 195 01/16/2017   Lab Results  Component Value Date   HDL 65.30 01/16/2017   Lab Results  Component Value Date   LDLCALC 117 (H) 01/16/2017   Lab Results  Component Value Date   TRIG 66.0 01/16/2017   Lab Results  Component Value Date   CHOLHDL 3 01/16/2017   Lab Results  Component Value Date   HGBA1C 5.7 01/16/2017       Assessment & Plan:   Problem List Items Addressed This Visit    Vitamin D deficiency    Supplement and monitor, she believes this is checked by endocrinology      MIXED HYPERLIPIDEMIA    Encouraged heart healthy diet, increase exercise, avoid trans fats, consider a krill oil cap daily      Osteoporosis    Encouraged to get adequate exercise, calcium and vitamin d intake      Relevant Medications   risedronate (ACTONEL) 35 MG tablet    Hypothyroidism    On Levothyroxine, continue to monitor follows with endocrinology, Dr Talmage Nap will sign release of records.      Diabetes type 2, controlled (HCC)    hgba1c acceptable, minimize simple carbs. Increase exercise as tolerated.      Preventative health care    Patient encouraged to maintain heart healthy diet, regular exercise, adequate sleep. Consider daily probiotics. Take medications as prescribed. Given and reviewed copy of ACP documents from [employer] and encouraged to complete and return.        Other Visit Diagnoses    High risk medication use    -  Primary   Relevant Orders   Pain Mgmt, Profile 8 w/Conf, U      I am having Kida Jacinto maintain her ONE TOUCH ULTRA TEST, levothyroxine, Vitamin D (Ergocalciferol), estradiol, estradiol, progesterone, ALPRAZolam, and risedronate.  Meds ordered this encounter  Medications  . ALPRAZolam (XANAX) 0.25 MG tablet    Sig: Take 1 tablet (0.25 mg total) by mouth 2 (two) times daily as needed for anxiety.    Dispense:  30 tablet    Refill:  1  . risedronate (ACTONEL) 35 MG tablet    Sig: Take 1 tablet (35 mg total) by mouth once a week. osteoporosis    Dispense:  4 tablet    Refill:  11     Danise Edge, MD

## 2018-03-08 NOTE — Assessment & Plan Note (Signed)
Patient encouraged to maintain heart healthy diet, regular exercise, adequate sleep. Consider daily probiotics. Take medications as prescribed. Given and reviewed copy of ACP documents from [place] Secretary of State and encouraged to complete and return 

## 2018-03-18 ENCOUNTER — Ambulatory Visit (INDEPENDENT_AMBULATORY_CARE_PROVIDER_SITE_OTHER): Payer: 59 | Admitting: Obstetrics & Gynecology

## 2018-03-18 ENCOUNTER — Encounter: Payer: Self-pay | Admitting: Obstetrics & Gynecology

## 2018-03-18 VITALS — BP 102/68 | Ht 61.0 in | Wt 103.0 lb

## 2018-03-18 DIAGNOSIS — Z79890 Hormone replacement therapy (postmenopausal): Secondary | ICD-10-CM

## 2018-03-18 DIAGNOSIS — M810 Age-related osteoporosis without current pathological fracture: Secondary | ICD-10-CM

## 2018-03-18 DIAGNOSIS — Z01419 Encounter for gynecological examination (general) (routine) without abnormal findings: Secondary | ICD-10-CM | POA: Diagnosis not present

## 2018-03-18 DIAGNOSIS — N950 Postmenopausal bleeding: Secondary | ICD-10-CM | POA: Diagnosis not present

## 2018-03-18 MED ORDER — ESTRADIOL 0.05 MG/24HR TD PTTW: 1.0000 | MEDICATED_PATCH | TRANSDERMAL | 4 refills | Status: DC

## 2018-03-18 MED ORDER — ESTRADIOL 0.1 MG/GM VA CREA: 0.2500 | TOPICAL_CREAM | VAGINAL | 4 refills | Status: DC

## 2018-03-18 MED ORDER — PROGESTERONE MICRONIZED 100 MG PO CAPS: 100.0000 mg | ORAL_CAPSULE | Freq: Every day | ORAL | 4 refills | Status: DC

## 2018-03-18 NOTE — Patient Instructions (Signed)
1. Well female exam with routine gynecological exam Normal gynecologic exam and menopause.  Pap test negative in September 2018.  Will repeat Pap test next year.  Breast exam normal.  Screening mammogram January 2020 was negative.  Health labs with family physician.  Colonoscopy in 2016.  Body mass index 19.46.  Continue with fitness and healthy nutrition.  2. Post-menopause on HRT (hormone replacement therapy) Well on hormone replacement therapy for 8 years.  Patient will follow-up with a pelvic ultrasound for mild postmenopausal bleeding that occurred in October 2019.  No recurrence since then.  Otherwise no contraindication to continue with hormone replacement therapy.  Vivelle-Dot 0.05 and Prometrium 100 mg at bedtime represcribed.  Estrace cream also represcribed for vaginal dryness.  3. Postmenopausal bleeding Mild postmenopausal bleeding in October 2019.  No recurrence since then.  Patient will follow-up with a pelvic ultrasound to confirm a normal endometrial line. - US Transvaginal Non-OB; Future  4. Age-related osteoporosis without current pathological fracture Osteoporosis on Actonel.  Will continue on Actonel at least until the next bone density in December 2020.  Patient is taking vitamin D supplements and calcium.  Continue with regular weightbearing physical activities.  Other orders - progesterone (PROMETRIUM) 100 MG capsule; Take 1 capsule (100 mg total) by mouth at bedtime. - estradiol (VIVELLE-DOT) 0.05 MG/24HR patch; Place 1 patch (0.05 mg total) onto the skin 2 (two) times a week. - estradiol (ESTRACE VAGINAL) 0.1 MG/GM vaginal cream; Place 0.25 Applicatorfuls vaginally 2 (two) times a week. Apply a thin layer on vulva 2 times a week as needed.  Kelli Barry, it was a pleasure seeing you today!

## 2018-03-18 NOTE — Progress Notes (Signed)
Kelli Barry May 26, 1965 [id number]   History:    53 y.o. G2P2L2 Separated   RP:  Established patient presenting for annual gyn exam   HPI: Menopause, well on hormone replacement therapy x about 8 years.  On Vivelle-Dot 0.05 and Prometrium 100 mg at bedtime.  Add mild postmenopausal bleeding in October 2019.  No bleeding since then.  No pelvic pain.  Not currently sexually active.  Prefers to continue with Estrace cream for vaginal dryness.  Breast normal.  Body mass index 19.46.  Good fitness and healthy nutrition.  On Actonel for osteoporosis.  Taking supplements of vitamin D and calcium.  Health labs with family physician.  Past medical history,surgical history, family history and social history were all reviewed and documented in the EPIC chart.  Gynecologic History Patient's last menstrual period was 01/14/2011. Contraception: abstinence and post menopausal status Last Pap: 10/2016. Results were: Negative Last mammogram: 02/2018. Results were: Negative Bone Density: 01/2017 Left Femur Neck -2.7 Colonoscopy: 02/2014  Obstetric History OB History  Gravida Para Term Preterm AB Living  2 2       2   SAB TAB Ectopic Multiple Live Births               # Outcome Date GA Lbr Len/2nd Weight Sex Delivery Anes PTL Lv  2 Para           1 Para              ROS: A ROS was performed and pertinent positives and negatives are included in the history.  GENERAL: No fevers or chills. HEENT: No change in vision, no earache, sore throat or sinus congestion. NECK: No pain or stiffness. CARDIOVASCULAR: No chest pain or pressure. No palpitations. PULMONARY: No shortness of breath, cough or wheeze. GASTROINTESTINAL: No abdominal pain, nausea, vomiting or diarrhea, melena or bright red blood per rectum. GENITOURINARY: No urinary frequency, urgency, hesitancy or dysuria. MUSCULOSKELETAL: No joint or muscle pain, no back pain, no recent trauma. DERMATOLOGIC: No rash, no itching, no lesions. ENDOCRINE: No  polyuria, polydipsia, no heat or cold intolerance. No recent change in weight. HEMATOLOGICAL: No anemia or easy bruising or bleeding. NEUROLOGIC: No headache, seizures, numbness, tingling or weakness. PSYCHIATRIC: No depression, no loss of interest in normal activity or change in sleep pattern.     Exam:   BP 102/68   Ht 5\' 1"  (1.549 m)   Wt 103 lb (46.7 kg)   LMP 01/14/2011   BMI 19.46 kg/m   Body mass index is 19.46 kg/m.  General appearance : Well developed well nourished female. No acute distress HEENT: Eyes: no retinal hemorrhage or exudates,  Neck supple, trachea midline, no carotid bruits, no thyroidmegaly Lungs: Clear to auscultation, no rhonchi or wheezes, or rib retractions  Heart: Regular rate and rhythm, no murmurs or gallops Breast:Examined in sitting and supine position were symmetrical in appearance, no palpable masses or tenderness,  no skin retraction, no nipple inversion, no nipple discharge, no skin discoloration, no axillary or supraclavicular lymphadenopathy Abdomen: no palpable masses or tenderness, no rebound or guarding Extremities: no edema or skin discoloration or tenderness  Pelvic: Vulva: Normal             Vagina: No gross lesions or discharge  Cervix: No gross lesions or discharge  Uterus  AV, normal size, shape and consistency, non-tender and mobile  Adnexa  Without masses or tenderness  Anus: Normal   Assessment/Plan:  53 y.o. female for annual exam   1.  Well female exam with routine gynecological exam Normal gynecologic exam and menopause.  Pap test negative in September 2018.  Will repeat Pap test next year.  Breast exam normal.  Screening mammogram January 2020 was negative.  Health labs with family physician.  Colonoscopy in 2016.  Body mass index 19.46.  Continue with fitness and healthy nutrition.  2. Post-menopause on HRT (hormone replacement therapy) Well on hormone replacement therapy for 8 years.  Patient will follow-up with a pelvic  ultrasound for mild postmenopausal bleeding that occurred in October 2019.  No recurrence since then.  Otherwise no contraindication to continue with hormone replacement therapy.  Vivelle-Dot 0.05 and Prometrium 100 mg at bedtime represcribed.  Estrace cream also represcribed for vaginal dryness.  3. Postmenopausal bleeding Mild postmenopausal bleeding in October 2019.  No recurrence since then.  Patient will follow-up with a pelvic ultrasound to confirm a normal endometrial line. - US Transvaginal Non-OB; Future  4. Age-related osteoporosis without current pathological fracture Osteoporosis on Actonel.  Will continue on Actonel at least until the next bone density in December 2020.  Patient is taking vitamin D supplements and calcium.  Continue with regular weightbearing physical activities.  Other orders - progesterone (PROMETRIUM) 100 MG capsule; Take 1 capsule (100 mg total) by mouth at bedtime. - estradiol (VIVELLE-DOT) 0.05 MG/24HR patch; Place 1 patch (0.05 mg total) onto the skin 2 (two) times a week. - estradiol (ESTRACE VAGINAL) 0.1 MG/GM vaginal cream; Place 0.25 Applicatorfuls vaginally 2 (two) times a week. Apply a thin layer on vulva 2 times a week as needed.  Genia DelMarie-Lyne Charlea Nardo MD, 10:10 AM 03/18/2018

## 2018-04-15 ENCOUNTER — Other Ambulatory Visit: Payer: 59

## 2018-04-15 ENCOUNTER — Ambulatory Visit: Payer: 59 | Admitting: Obstetrics & Gynecology

## 2018-12-01 ENCOUNTER — Telehealth: Payer: Self-pay | Admitting: Family Medicine

## 2018-12-01 NOTE — Telephone Encounter (Signed)
Pt called in for a refill for ALPRAZolam (XANAX) 0.25 MG tablet    Pharmacy:  [id number] - [place], [address] [phone] (Phone) [phone] (Fax)

## 2018-12-02 NOTE — Telephone Encounter (Signed)
She has to be seen every 6 months. I will allow #15 til she gets an appt

## 2018-12-02 NOTE — Telephone Encounter (Signed)
Appointment made for virtual visit 12/09/18

## 2018-12-02 NOTE — Telephone Encounter (Signed)
Requesting:xanax Contract:yes UDS:n/a Last OV:03/05/18 Next OV:n/a Last Refill:02/1718  #[id number] Database:   Please advise

## 2018-12-09 ENCOUNTER — Ambulatory Visit (INDEPENDENT_AMBULATORY_CARE_PROVIDER_SITE_OTHER): Payer: 59 | Admitting: Family Medicine

## 2018-12-09 ENCOUNTER — Encounter: Payer: Self-pay | Admitting: Family Medicine

## 2018-12-09 ENCOUNTER — Other Ambulatory Visit: Payer: Self-pay

## 2018-12-09 DIAGNOSIS — E538 Deficiency of other specified B group vitamins: Secondary | ICD-10-CM | POA: Diagnosis not present

## 2018-12-09 DIAGNOSIS — T7840XS Allergy, unspecified, sequela: Secondary | ICD-10-CM

## 2018-12-09 DIAGNOSIS — F419 Anxiety disorder, unspecified: Secondary | ICD-10-CM

## 2018-12-09 DIAGNOSIS — E559 Vitamin D deficiency, unspecified: Secondary | ICD-10-CM

## 2018-12-09 DIAGNOSIS — E119 Type 2 diabetes mellitus without complications: Secondary | ICD-10-CM

## 2018-12-12 DIAGNOSIS — E538 Deficiency of other specified B group vitamins: Secondary | ICD-10-CM | POA: Insufficient documentation

## 2018-12-12 NOTE — Assessment & Plan Note (Signed)
She got her flu shot at Dr Lilli Few her allergist's office.

## 2018-12-12 NOTE — Assessment & Plan Note (Signed)
Is following with endocrinology. Supplement and monitor

## 2018-12-12 NOTE — Assessment & Plan Note (Signed)
Follows with endocrinology and reports good control

## 2018-12-12 NOTE — Assessment & Plan Note (Signed)
Has been very stressed during the pandemic but does feel she is managing well wit current meds and occasional doses of alprazolam can continue the same. She has lost her Dad and is very overwhelmed by settling the estate and managing her mother's failing health.

## 2018-12-12 NOTE — Progress Notes (Signed)
Virtual Visit via Video Note  I connected with [name] on 12/12/18 at  3:00 PM EDT by a video enabled telemedicine application and verified that I am speaking with the correct person using two identifiers.  Location: Patient: home Provider: office   I discussed the limitations of evaluation and management by telemedicine and the availability of in person appointments. The patient expressed understanding and agreed to proceed.    Subjective:    Patient ID: Kelli Barry, female    DOB: 12-May-1965, 53 y.o.   MRN: [id number]  Chief Complaint  Patient presents with  . Anxiety    refills for alprazolam    HPI Patient is in today for follow up on chronic medical concerns including anxiety, diabetes, allergies, elevated cholesterol and more. No recent febrile illness or hospitalizations. Denies CP/palp/SOB/HA/congestion/fevers/GI or GU c/o. Taking meds as prescribed. She has been very stressed managing her father's affairs since he died. She is managing her mother's declining health as well.   Past Medical History:  Diagnosis Date  . Abdominal pain 08/06/2014  . Acute bronchitis 12/15/2010  . Allergy    seasonal  . Anxiety 10/16/2016  . Diabetes mellitus without complication (HCC)    diet controlled  . Diabetes type 2, controlled ([place]) 08/06/2014   Follows with Dr Chalmers Cater  . Hyperlipidemia   . Hypothyroidism 08/06/2014  . Osteoporosis   . Osteoporosis 12/05/2009   Qualifier: Diagnosis of  By: Charlett Blake MD, Erline Levine    . Preventative health care 08/06/2014  . Rash 10/16/2016    Past Surgical History:  Procedure Laterality Date  . NO PAST SURGERIES      Family History  Problem Relation Age of Onset  . Diabetes Father        DM II  . Hypertension Father   . Melanoma Father   . Dementia Father   . Memory loss Father   . Parkinsonism Father   . Parkinson's disease Father        lewy body  . Hypertension Mother   . Fibroids Mother        uterine  . Hyperlipidemia Mother    . Stroke Mother   . Atrial fibrillation Mother   . Cancer Maternal Grandmother        colon  . Coronary artery disease Maternal Grandmother   . Heart attack Maternal Grandmother   . Colon cancer Maternal Grandmother 19  . Heart disease Maternal Grandfather        MI at roughly 50  . Stroke Paternal Grandmother   . Dementia Paternal Grandmother   . Cancer Paternal Grandfather        Lung  . Emphysema Paternal Grandfather   . Ovarian cysts Sister   . Heart disease Maternal Aunt 72       MI  . Heart disease Maternal Uncle 1       ish  . Heart disease Maternal Uncle 81       ish, MI  . Allergies Son     Social History   Socioeconomic History  . Marital status: Married    Spouse name: Not on file  . Number of children: Not on file  . Years of education: Not on file  . Highest education level: Not on file  Occupational History  . Not on file  Social Needs  . Financial resource strain: Not on file  . Food insecurity    Worry: Not on file    Inability: Not on file  . Transportation needs  Medical: Not on file    Non-medical: Not on file  Tobacco Use  . Smoking status: Never Smoker  . Smokeless tobacco: Never Used  Substance and Sexual Activity  . Alcohol use: Yes    Alcohol/week: 2.0 standard drinks    Types: 2 Glasses of wine per week  . Drug use: No  . Sexual activity: Not Currently    Birth control/protection: Post-menopausal    Comment: 1st intercourse- 17, partner- 1, married- 31 yrs   Lifestyle  . Physical activity    Days per week: Not on file    Minutes per session: Not on file  . Stress: Not on file  Relationships  . Social Musician on phone: Not on file    Gets together: Not on file    Attends religious service: Not on file    Active member of club or organization: Not on file    Attends meetings of clubs or organizations: Not on file    Relationship status: Not on file  . Intimate partner violence    Fear of current or ex partner:  Not on file    Emotionally abused: Not on file    Physically abused: Not on file    Forced sexual activity: Not on file  Other Topics Concern  . Not on file  Social History Narrative  . Not on file    Outpatient Medications Prior to Visit  Medication Sig Dispense Refill  . ALPRAZolam (XANAX) 0.25 MG tablet Take 1 tablet (0.25 mg total) by mouth 2 (two) times daily as needed for anxiety. 30 tablet 1  . estradiol (ESTRACE VAGINAL) 0.1 MG/GM vaginal cream Place 0.25 Applicatorfuls vaginally 2 (two) times a week. Apply a thin layer on vulva 2 times a week as needed. 42.5 g 4  . estradiol (VIVELLE-DOT) 0.05 MG/24HR patch Place 1 patch (0.05 mg total) onto the skin 2 (two) times a week. 24 patch 4  . levothyroxine (SYNTHROID, LEVOTHROID) 75 MCG tablet Take 75 mcg by mouth daily before breakfast.    . ONE TOUCH ULTRA TEST test strip 1 strip.  4  . progesterone (PROMETRIUM) 100 MG capsule Take 1 capsule (100 mg total) by mouth at bedtime. 90 capsule 4  . risedronate (ACTONEL) 35 MG tablet Take 1 tablet (35 mg total) by mouth once a week. osteoporosis 4 tablet 11  . Vitamin D, Ergocalciferol, (DRISDOL) 50000 units CAPS capsule Take 50,000 Units by mouth every 7 (seven) days.     No facility-administered medications prior to visit.     Allergies  Allergen Reactions  . Codeine   . Penicillins     Review of Systems  Constitutional: Positive for malaise/fatigue. Negative for fever.  HENT: Positive for congestion.   Eyes: Negative for blurred vision.  Respiratory: Negative for shortness of breath.   Cardiovascular: Negative for chest pain, palpitations and leg swelling.  Gastrointestinal: Negative for abdominal pain, blood in stool and nausea.  Genitourinary: Negative for dysuria and frequency.  Musculoskeletal: Negative for falls.  Skin: Negative for rash.  Neurological: Negative for dizziness, loss of consciousness and headaches.  Endo/Heme/Allergies: Negative for environmental  allergies.  Psychiatric/Behavioral: Negative for depression. The patient is nervous/anxious.        Objective:    Physical Exam Vitals signs and nursing note reviewed.  Constitutional:      General: She is not in acute distress.    Appearance: She is well-developed.  HENT:     Head: Normocephalic and atraumatic.  Nose: Nose normal.  Eyes:     General:        Right eye: No discharge.        Left eye: No discharge.  Neck:     Musculoskeletal: Normal range of motion and neck supple.  Pulmonary:     Effort: Pulmonary effort is normal.  Abdominal:     Tenderness: There is no abdominal tenderness.  Neurological:     Mental Status: She is alert and oriented to person, place, and time.  Psychiatric:        Mood and Affect: Mood normal.        Behavior: Behavior normal.     BP 113/72   Temp 98.5 F (36.9 C) (Oral)   Wt 105 lb (47.6 kg)   LMP 01/14/2011   BMI 19.84 kg/m  Wt Readings from Last 3 Encounters:  12/09/18 105 lb (47.6 kg)  03/18/18 103 lb (46.7 kg)  03/05/18 105 lb 3.2 oz (47.7 kg)    Diabetic Foot Exam - Simple   No data filed     Lab Results  Component Value Date   WBC 10.3 01/16/2017   HGB 13.5 01/16/2017   HCT 40.8 01/16/2017   PLT 277.0 01/16/2017   GLUCOSE 90 01/16/2017   CHOL 195 01/16/2017   TRIG 66.0 01/16/2017   HDL 65.30 01/16/2017   LDLCALC 117 (H) 01/16/2017   ALT 9 01/16/2017   AST 14 01/16/2017   NA 138 01/16/2017   K 4.4 01/16/2017   CL 102 01/16/2017   CREATININE 0.79 01/16/2017   BUN 19 01/16/2017   CO2 29 01/16/2017   HGBA1C 5.7 01/16/2017    No results found for: TSH Lab Results  Component Value Date   WBC 10.3 01/16/2017   HGB 13.5 01/16/2017   HCT 40.8 01/16/2017   MCV 89.6 01/16/2017   PLT 277.0 01/16/2017   Lab Results  Component Value Date   NA 138 01/16/2017   K 4.4 01/16/2017   CO2 29 01/16/2017   GLUCOSE 90 01/16/2017   BUN 19 01/16/2017   CREATININE 0.79 01/16/2017   BILITOT 0.6 01/16/2017    ALKPHOS 60 01/16/2017   AST 14 01/16/2017   ALT 9 01/16/2017   PROT 7.2 01/16/2017   ALBUMIN 4.4 01/16/2017   CALCIUM 9.6 01/16/2017   GFR 80.65 01/16/2017   Lab Results  Component Value Date   CHOL 195 01/16/2017   Lab Results  Component Value Date   HDL 65.30 01/16/2017   Lab Results  Component Value Date   LDLCALC 117 (H) 01/16/2017   Lab Results  Component Value Date   TRIG 66.0 01/16/2017   Lab Results  Component Value Date   CHOLHDL 3 01/16/2017   Lab Results  Component Value Date   HGBA1C 5.7 01/16/2017       Assessment & Plan:   Problem List Items Addressed This Visit    Vitamin D deficiency    Is supplementing and follows with her endocrinologist.       Allergy    She got her flu shot at Dr Shon BatonWhalen's her allergist's office.       Diabetes type 2, controlled (HCC)    Follows with endocrinology and reports good control      Anxiety    Has been very stressed during the pandemic but does feel she is managing well wit current meds and occasional doses of alprazolam can continue the same. She has lost her Dad and is very overwhelmed by settling  the estate and managing her mother's failing health.       Vitamin B12 deficiency    Is following with endocrinology. Supplement and monitor         I am having Reid Lipkin maintain her ONE TOUCH ULTRA TEST, levothyroxine, Vitamin D (Ergocalciferol), ALPRAZolam, risedronate, progesterone, estradiol, and estradiol.  No orders of the defined types were placed in this encounter.    I discussed the assessment and treatment plan with the patient. The patient was provided an opportunity to ask questions and all were answered. The patient agreed with the plan and demonstrated an understanding of the instructions.   The patient was advised to call back or seek an in-person evaluation if the symptoms worsen or if the condition fails to improve as anticipated.  I provided 25 minutes of non-face-to-face time  during this encounter.   Danise Edge, MD

## 2018-12-12 NOTE — Assessment & Plan Note (Signed)
Is supplementing and follows with her endocrinologist.

## 2018-12-17 ENCOUNTER — Other Ambulatory Visit: Payer: Self-pay

## 2018-12-17 MED ORDER — ALPRAZOLAM 0.25 MG PO TABS: 0.2500 mg | ORAL_TABLET | Freq: Two times a day (BID) | ORAL | 1 refills | Status: DC | PRN

## 2018-12-17 NOTE — Telephone Encounter (Signed)
Copied from [place] [id number]. Topic: Quick Communication - Rx Refill/Question >> Dec 17, 2018  3:16 PM Carolyn Stare wrote: Medication ALPRAZolam Duanne Moron) 0.25 MG tablet  Preferred Pharmacy [facility]   Agent: Please be advised that RX refills may take up to 3 business days. We ask that you follow-up with your pharmacy.    Requesting:xanax  Contract:yes UDS:n/a Last OV:12/09/18 Next OV:n/a Last Refill:03/05/18  #[id number] Database:   Please advise

## 2018-12-20 ENCOUNTER — Telehealth: Payer: Self-pay | Admitting: Family Medicine

## 2018-12-20 NOTE — Telephone Encounter (Signed)
LM to schedule f/u appt end of April 2021

## 2019-03-22 ENCOUNTER — Other Ambulatory Visit: Payer: Self-pay | Admitting: Obstetrics & Gynecology

## 2019-03-22 NOTE — Telephone Encounter (Signed)
CE scheduled 04/13/19.

## 2019-03-25 ENCOUNTER — Encounter: Payer: 59 | Admitting: Obstetrics & Gynecology

## 2019-04-13 ENCOUNTER — Encounter: Payer: Self-pay | Admitting: Obstetrics & Gynecology

## 2019-04-13 ENCOUNTER — Other Ambulatory Visit: Payer: Self-pay

## 2019-04-13 ENCOUNTER — Ambulatory Visit (INDEPENDENT_AMBULATORY_CARE_PROVIDER_SITE_OTHER): Payer: 59 | Admitting: Obstetrics & Gynecology

## 2019-04-13 VITALS — BP 110/70 | Ht 60.5 in | Wt 108.2 lb

## 2019-04-13 DIAGNOSIS — Z79890 Hormone replacement therapy (postmenopausal): Secondary | ICD-10-CM

## 2019-04-13 DIAGNOSIS — Z01419 Encounter for gynecological examination (general) (routine) without abnormal findings: Secondary | ICD-10-CM

## 2019-04-13 DIAGNOSIS — M810 Age-related osteoporosis without current pathological fracture: Secondary | ICD-10-CM

## 2019-04-13 LAB — RESULTS CONSOLE HPV: CHL HPV: NEGATIVE

## 2019-04-13 LAB — HM PAP SMEAR: HM Pap smear: NORMAL

## 2019-04-13 MED ORDER — PROGESTERONE MICRONIZED 100 MG PO CAPS: 100.0000 mg | ORAL_CAPSULE | Freq: Every day | ORAL | 4 refills | Status: DC

## 2019-04-13 MED ORDER — ESTRADIOL 0.1 MG/GM VA CREA: 0.2500 | TOPICAL_CREAM | VAGINAL | 4 refills | Status: DC

## 2019-04-13 MED ORDER — ESTRADIOL 0.05 MG/24HR TD PTTW: 1.0000 | MEDICATED_PATCH | TRANSDERMAL | 4 refills | Status: DC

## 2019-04-13 NOTE — Progress Notes (Signed)
Kelli Barry November 09, 1965 [id number]   History:    54 y.o. G2P2L2 Separated   RP:  Established patient presenting for annual gyn exam   HPI: Menopause, well on hormone replacement therapy x about 9 years.  On Vivelle-Dot 0.05 and Prometrium 100 mg at bedtime.  No bleeding since then.  No pelvic pain.  Not currently sexually active. Prefers to continue with Estrace cream for vaginal dryness.  Breasts normal.  Body mass index 20.78.  Good fitness and healthy nutrition.  On Actonel for osteoporosis, followed by Endocrino.  Taking supplements of vitamin D and calcium.  Health labs with family physician.   Past medical history,surgical history, family history and social history were all reviewed and documented in the EPIC chart.  Gynecologic History Patient's last menstrual period was 01/14/2011.  Obstetric History OB History  Gravida Para Term Preterm AB Living  2 2       2   SAB TAB Ectopic Multiple Live Births               # Outcome Date GA Lbr Len/2nd Weight Sex Delivery Anes PTL Lv  2 Para           1 Para              ROS: A ROS was performed and pertinent positives and negatives are included in the history.  GENERAL: No fevers or chills. HEENT: No change in vision, no earache, sore throat or sinus congestion. NECK: No pain or stiffness. CARDIOVASCULAR: No chest pain or pressure. No palpitations. PULMONARY: No shortness of breath, cough or wheeze. GASTROINTESTINAL: No abdominal pain, nausea, vomiting or diarrhea, melena or bright red blood per rectum. GENITOURINARY: No urinary frequency, urgency, hesitancy or dysuria. MUSCULOSKELETAL: No joint or muscle pain, no back pain, no recent trauma. DERMATOLOGIC: No rash, no itching, no lesions. ENDOCRINE: No polyuria, polydipsia, no heat or cold intolerance. No recent change in weight. HEMATOLOGICAL: No anemia or easy bruising or bleeding. NEUROLOGIC: No headache, seizures, numbness, tingling or weakness. PSYCHIATRIC: No depression, no  loss of interest in normal activity or change in sleep pattern.     Exam:   BP 110/70   Ht 5' 0.5" (1.537 m)   Wt 108 lb 3.2 oz (49.1 kg)   LMP 01/14/2011   BMI 20.78 kg/m   Body mass index is 20.78 kg/m.  General appearance : Well developed well nourished female. No acute distress HEENT: Eyes: no retinal hemorrhage or exudates,  Neck supple, trachea midline, no carotid bruits, no thyroidmegaly Lungs: Clear to auscultation, no rhonchi or wheezes, or rib retractions  Heart: Regular rate and rhythm, no murmurs or gallops Breast:Examined in sitting and supine position were symmetrical in appearance, no palpable masses or tenderness,  no skin retraction, no nipple inversion, no nipple discharge, no skin discoloration, no axillary or supraclavicular lymphadenopathy Abdomen: no palpable masses or tenderness, no rebound or guarding Extremities: no edema or skin discoloration or tenderness  Pelvic: Vulva: Normal             Vagina: No gross lesions or discharge  Cervix: No gross lesions or discharge.  Pap reflex done.  Uterus AV, normal size, shape and consistency, non-tender and mobile  Adnexa  Without masses or tenderness  Anus: Normal   Assessment/Plan:  54 y.o. female for annual exam   1. Encounter for routine gynecological examination with Papanicolaou smear of cervix Normal gynecologic exam.  Pap reflex done.  Breast exam normal.  Body mass index good at  20.78.  Continue with fitness and healthy nutrition.  Health labs with family physician.  2. Post-menopause on HRT (hormone replacement therapy) Well on hormone replacement therapy.  No postmenopausal bleeding.  No contraindication to continue on hormone replacement therapy.  Estradiol patch 0.05 twice a week and progesterone 100 mg capsule 1 capsule at bedtime daily represcribed.  Using Estrace cream for vaginal dryness, represcribed.  3. Age-related osteoporosis without current pathological fracture Followed by endocrinology   On Actonel treatment.  Taking vitamin D and calcium supplements.  Doing weightbearing physical activities on a regular basis.  Other orders - estradiol (ESTRACE VAGINAL) 0.1 MG/GM vaginal cream; Place 3.97 Applicatorfuls vaginally 2 (two) times a week. Apply a thin layer on vulva 2 times a week as needed. - estradiol (VIVELLE-DOT) 0.05 MG/24HR patch; Place 1 patch (0.05 mg total) onto the skin 2 (two) times a week. - progesterone (PROMETRIUM) 100 MG capsule; Take 1 capsule (100 mg total) by mouth at bedtime.  [name] Bruins MD, 8:40 AM 04/13/2019

## 2019-04-14 LAB — PAP IG W/ RFLX HPV ASCU

## 2019-04-16 ENCOUNTER — Encounter: Payer: Self-pay | Admitting: Obstetrics & Gynecology

## 2019-04-16 NOTE — Patient Instructions (Signed)
1. Encounter for routine gynecological examination with Papanicolaou smear of cervix Normal gynecologic exam.  Pap reflex done.  Breast exam normal.  Body mass index good at 20.78.  Continue with fitness and healthy nutrition.  Health labs with family physician.  2. Post-menopause on HRT (hormone replacement therapy) Well on hormone replacement therapy.  No postmenopausal bleeding.  No contraindication to continue on hormone replacement therapy.  Estradiol patch 0.05 twice a week and progesterone 100 mg capsule 1 capsule at bedtime daily represcribed.  Using Estrace cream for vaginal dryness, represcribed.  3. Age-related osteoporosis without current pathological fracture Followed by endocrinology  On Actonel treatment.  Taking vitamin D and calcium supplements.  Doing weightbearing physical activities on a regular basis.  Other orders - estradiol (ESTRACE VAGINAL) 0.1 MG/GM vaginal cream; Place 0.25 Applicatorfuls vaginally 2 (two) times a week. Apply a thin layer on vulva 2 times a week as needed. - estradiol (VIVELLE-DOT) 0.05 MG/24HR patch; Place 1 patch (0.05 mg total) onto the skin 2 (two) times a week. - progesterone (PROMETRIUM) 100 MG capsule; Take 1 capsule (100 mg total) by mouth at bedtime.  Jasdeep, it was a pleasure seeing you today!  I will inform you of your results as soon as they are available.

## 2019-05-03 ENCOUNTER — Encounter: Payer: Self-pay | Admitting: Family Medicine

## 2019-05-03 ENCOUNTER — Other Ambulatory Visit: Payer: Self-pay

## 2019-05-03 ENCOUNTER — Ambulatory Visit (INDEPENDENT_AMBULATORY_CARE_PROVIDER_SITE_OTHER): Payer: 59 | Admitting: Family Medicine

## 2019-05-03 VITALS — BP 110/70 | Wt 105.0 lb

## 2019-05-03 DIAGNOSIS — I83811 Varicose veins of right lower extremities with pain: Secondary | ICD-10-CM

## 2019-05-03 DIAGNOSIS — E538 Deficiency of other specified B group vitamins: Secondary | ICD-10-CM

## 2019-05-03 DIAGNOSIS — E782 Mixed hyperlipidemia: Secondary | ICD-10-CM

## 2019-05-03 DIAGNOSIS — M810 Age-related osteoporosis without current pathological fracture: Secondary | ICD-10-CM | POA: Diagnosis not present

## 2019-05-03 DIAGNOSIS — E039 Hypothyroidism, unspecified: Secondary | ICD-10-CM

## 2019-05-03 DIAGNOSIS — Z1239 Encounter for other screening for malignant neoplasm of breast: Secondary | ICD-10-CM

## 2019-05-03 DIAGNOSIS — Z0000 Encounter for general adult medical examination without abnormal findings: Secondary | ICD-10-CM | POA: Diagnosis not present

## 2019-05-03 DIAGNOSIS — E119 Type 2 diabetes mellitus without complications: Secondary | ICD-10-CM

## 2019-05-03 DIAGNOSIS — E559 Vitamin D deficiency, unspecified: Secondary | ICD-10-CM

## 2019-05-03 NOTE — Patient Instructions (Addendum)
Omron Blood Pressure cuff, upper arm, want BP 100-140/60-90 Pulse oximeter, want oxygen in 90s  Weekly vitals  Take Multivitamin with minerals, selenium Vitamin D 1000-2000 IU daily Probiotic with lactobacillus and bifidophilus Asprin EC 81 mg daily Krill or fish oil daily  Melatonin 2-5 mg at bedtime  [email or web address] Recommend calcium intake of 1200 to 1500 mg daily, divided into roughly 3 doses. Best source is the diet and a single dairy serving is about 500 mg, a supplement of calcium citrate once or twice daily to balance diet is fine if not getting enough in diet. Also need Vitamin D 2000 IU caps, 1 cap daily if not already taking vitamin D. Also recommend weight baring exercise on hips and upper body to keep bones strong.  The mRNA technology has been in development for 20 years and we already had the Coronavirus family of viruses (which usually just cause the common cold) genetically mapped already which is why we were able to come up with viable vaccine candidates so quickly in stage 1, then stage 2 scientifically took the correct amount of time what we did to speed it up was just build the manufacturing platform at the same time we were running the experiments so if it worked we could produce faster. And stage 3 has now had many months and millions of people immunized and we are seeing the immunity hold for over 9 months now with sign of it dissipating and no significant numbers of adverse reactions.  During every flu season we see 2 anaphylactic reactions for every million shots given and we initially thought we would see 11 per million with the COVID vaccine but now we see only 2-3 with Moderna and 5 or so with Evergreen so compared to someone is dying every 20 minutes from [place] and more deadly and infectious strains are coming it is definitely best when weighing the risks and benefits to take the shots.  Another pooled analysis of the 5 most  utilized vaccines in the world shows that after full immunization so far no one has died from [place].   Preventive Care 34-43 Years Old, Female Preventive care refers to visits with your health care provider and lifestyle choices that can promote health and wellness. This includes:  A yearly physical exam. This may also be called an annual well check.  Regular dental visits and eye exams.  Immunizations.  Screening for certain conditions.  Healthy lifestyle choices, such as eating a healthy diet, getting regular exercise, not using drugs or products that contain nicotine and tobacco, and limiting alcohol use. What can I expect for my preventive care visit? Physical exam Your health care provider will check your:  Height and weight. This may be used to calculate body mass index (BMI), which tells if you are at a healthy weight.  Heart rate and blood pressure.  Skin for abnormal spots. Counseling Your health care provider may ask you questions about your:  Alcohol, tobacco, and drug use.  Emotional well-being.  Home and relationship well-being.  Sexual activity.  Eating habits.  Work and work Statistician.  Method of birth control.  Menstrual cycle.  Pregnancy history. What immunizations do I need?  Influenza (flu) vaccine  This is recommended every year. Tetanus, diphtheria, and pertussis (Tdap) vaccine  You may need a Td booster every 10 years. Varicella (chickenpox) vaccine  You may need this if you have not been vaccinated. Zoster (shingles) vaccine  You may need this after age 40. Measles, mumps,  and rubella (MMR) vaccine  You may need at least one dose of MMR if you were born in 1957 or later. You may also need a second dose. Pneumococcal conjugate (PCV13) vaccine  You may need this if you have certain conditions and were not previously vaccinated. Pneumococcal polysaccharide (PPSV23) vaccine  You may need one or two doses if you smoke cigarettes or  if you have certain conditions. Meningococcal conjugate (MenACWY) vaccine  You may need this if you have certain conditions. Hepatitis A vaccine  You may need this if you have certain conditions or if you travel or work in places where you may be exposed to hepatitis A. Hepatitis B vaccine  You may need this if you have certain conditions or if you travel or work in places where you may be exposed to hepatitis B. Haemophilus influenzae type b (Hib) vaccine  You may need this if you have certain conditions. Human papillomavirus (HPV) vaccine  If recommended by your health care provider, you may need three doses over 6 months. You may receive vaccines as individual doses or as more than one vaccine together in one shot (combination vaccines). Talk with your health care provider about the risks and benefits of combination vaccines. What tests do I need? Blood tests  Lipid and cholesterol levels. These may be checked every 5 years, or more frequently if you are over 78 years old.  Hepatitis C test.  Hepatitis B test. Screening  Lung cancer screening. You may have this screening every year starting at age 58 if you have a 30-pack-year history of smoking and currently smoke or have quit within the past 15 years.  Colorectal cancer screening. All adults should have this screening starting at age 66 and continuing until age 56. Your health care provider may recommend screening at age 31 if you are at increased risk. You will have tests every 1-10 years, depending on your results and the type of screening test.  Diabetes screening. This is done by checking your blood sugar (glucose) after you have not eaten for a while (fasting). You may have this done every 1-3 years.  Mammogram. This may be done every 1-2 years. Talk with your health care provider about when you should start having regular mammograms. This may depend on whether you have a family history of breast cancer.  BRCA-related  cancer screening. This may be done if you have a family history of breast, ovarian, tubal, or peritoneal cancers.  Pelvic exam and Pap test. This may be done every 3 years starting at age 37. Starting at age 60, this may be done every 5 years if you have a Pap test in combination with an HPV test. Other tests  Sexually transmitted disease (STD) testing.  Bone density scan. This is done to screen for osteoporosis. You may have this scan if you are at high risk for osteoporosis. Follow these instructions at home: Eating and drinking  Eat a diet that includes fresh fruits and vegetables, whole grains, lean protein, and low-fat dairy.  Take vitamin and mineral supplements as recommended by your health care provider.  Do not drink alcohol if: ? Your health care provider tells you not to drink. ? You are pregnant, may be pregnant, or are planning to become pregnant.  If you drink alcohol: ? Limit how much you have to 0-1 drink a day. ? Be aware of how much alcohol is in your drink. In the [place] one drink equals one 12 oz bottle of  beer (355 mL), one 5 oz glass of wine (148 mL), or one 1 oz glass of hard liquor (44 mL). Lifestyle  Take daily care of your teeth and gums.  Stay active. Exercise for at least 30 minutes on 5 or more days each week.  Do not use any products that contain nicotine or tobacco, such as cigarettes, e-cigarettes, and chewing tobacco. If you need help quitting, ask your health care provider.  If you are sexually active, practice safe sex. Use a condom or other form of birth control (contraception) in order to prevent pregnancy and STIs (sexually transmitted infections).  If told by your health care provider, take low-dose aspirin daily starting at age 42. What's next?  Visit your health care provider once a year for a well check visit.  Ask your health care provider how often you should have your eyes and teeth checked.  Stay up to date on all vaccines. This  information is not intended to replace advice given to you by your health care provider. Make sure you discuss any questions you have with your health care provider. Document Revised: 10/15/2017 Document Reviewed: 10/15/2017 Elsevier Patient Education  2020 [employer].

## 2019-05-04 DIAGNOSIS — I8390 Asymptomatic varicose veins of unspecified lower extremity: Secondary | ICD-10-CM | POA: Insufficient documentation

## 2019-05-04 NOTE — Assessment & Plan Note (Signed)
Encouraged heart healthy diet, increase exercise, avoid trans fats. She will be getting labs done at her endocrinologist soon and she will forward [place] the results

## 2019-05-04 NOTE — Assessment & Plan Note (Signed)
Follows with endocrinology and has labs ordered soon.

## 2019-05-04 NOTE — Assessment & Plan Note (Signed)
Knee area, she is wearing compression hose and is considering seeking care with vascular surgery she will let us know if she wants a referral

## 2019-05-04 NOTE — Assessment & Plan Note (Signed)
Dexa Scan ordered. Encouraged to get adequate exercise, calcium and vitamin d intake.

## 2019-05-04 NOTE — Progress Notes (Signed)
Virtual Visit via Video Note  I connected with Kelli Barry on 05/03/19 at  2:00 PM EDT by a video enabled telemedicine application and verified that I am speaking with the correct person using two identifiers.  Location: Patient: home Provider: office   I discussed the limitations of evaluation and management by telemedicine and the availability of in person appointments. The patient expressed understanding and agreed to proceed. Kelli Barry, CMA was able to get the patient set up on a visit, video   Subjective:    Patient ID: Kelli Barry, female    DOB: 06-09-1965, 54 y.o.   MRN: [id number]  Chief Complaint  Patient presents with  . Annual Exam    HPI Patient is in today for annual preventative exam. No recent febrile illness or hospitalizations. She has been able to work at home and has not had any acute concerns. She has had some trouble with a varicose vein in her right leg that becomes uncomfortable at times. She is trying to stay active and maintain a heart healthy diet. Denies CP/palp/SOB/HA/congestion/fevers/GI or GU c/o. Taking meds as prescribed  Past Medical History:  Diagnosis Date  . Abdominal pain 08/06/2014  . Acute bronchitis 12/15/2010  . Allergy    seasonal  . Anxiety 10/16/2016  . Diabetes mellitus without complication (HCC)    diet controlled  . Diabetes type 2, controlled (HCC) 08/06/2014   Follows with Dr Talmage Nap  . Hyperlipidemia   . Hypothyroidism 08/06/2014  . Osteoporosis   . Osteoporosis 12/05/2009   Qualifier: Diagnosis of  By: Abner Greenspan MD, Misty Stanley    . Preventative health care 08/06/2014  . Rash 10/16/2016    Past Surgical History:  Procedure Laterality Date  . NO PAST SURGERIES      Family History  Problem Relation Age of Onset  . Diabetes Father        DM II  . Hypertension Father   . Melanoma Father   . Dementia Father   . Memory loss Father   . Parkinsonism Father   . Parkinson's disease Father        lewy body  .  Hypertension Mother   . Fibroids Mother        uterine  . Hyperlipidemia Mother   . Stroke Mother   . Atrial fibrillation Mother   . Dementia Mother   . Cancer Maternal Grandmother        colon  . Coronary artery disease Maternal Grandmother   . Heart attack Maternal Grandmother   . Colon cancer Maternal Grandmother 75  . Heart disease Maternal Grandfather        MI at roughly 49  . Stroke Paternal Grandmother   . Dementia Paternal Grandmother   . Cancer Paternal Grandfather        Lung  . Emphysema Paternal Grandfather   . Ovarian cysts Sister   . Heart disease Maternal Aunt 72       MI  . Heart disease Maternal Uncle 43       ish  . Heart disease Maternal Uncle 31       ish, MI  . Allergies Son     Social History   Socioeconomic History  . Marital status: Married    Spouse name: Not on file  . Number of children: Not on file  . Years of education: Not on file  . Highest education level: Not on file  Occupational History  . Not on file  Tobacco Use  . Smoking status:  Never Smoker  . Smokeless tobacco: Never Used  Substance and Sexual Activity  . Alcohol use: Yes    Alcohol/week: 2.0 standard drinks    Types: 2 Glasses of wine per week  . Drug use: No  . Sexual activity: Not Currently    Birth control/protection: Post-menopausal    Comment: 1st intercourse- 45, partner- 1, married- 32 yrs   Other Topics Concern  . Not on file  Social History Narrative  . Not on file   Social Determinants of Health   Financial Resource Strain:   . Difficulty of Paying Living Expenses:   Food Insecurity:   . Worried About Programme researcher, broadcasting/film/video in the Last Year:   . Barista in the Last Year:   Transportation Needs:   . Freight forwarder (Medical):   Marland Kitchen Lack of Transportation (Non-Medical):   Physical Activity:   . Days of Exercise per Week:   . Minutes of Exercise per Session:   Stress:   . Feeling of Stress :   Social Connections:   . Frequency of  Communication with Friends and Family:   . Frequency of Social Gatherings with Friends and Family:   . Attends Religious Services:   . Active Member of Clubs or Organizations:   . Attends Banker Meetings:   Marland Kitchen Marital Status:   Intimate Partner Violence:   . Fear of Current or Ex-Partner:   . Emotionally Abused:   Marland Kitchen Physically Abused:   . Sexually Abused:     Outpatient Medications Prior to Visit  Medication Sig Dispense Refill  . ALPRAZolam (XANAX) 0.25 MG tablet Take 1 tablet (0.25 mg total) by mouth 2 (two) times daily as needed for anxiety. 30 tablet 1  . estradiol (ESTRACE VAGINAL) 0.1 MG/GM vaginal cream Place 0.25 Applicatorfuls vaginally 2 (two) times a week. Apply a thin layer on vulva 2 times a week as needed. 42.5 g 4  . estradiol (VIVELLE-DOT) 0.05 MG/24HR patch Place 1 patch (0.05 mg total) onto the skin 2 (two) times a week. 24 patch 4  . levothyroxine (SYNTHROID, LEVOTHROID) 75 MCG tablet Take 75 mcg by mouth daily before breakfast.    . progesterone (PROMETRIUM) 100 MG capsule Take 1 capsule (100 mg total) by mouth at bedtime. 90 capsule 4  . risedronate (ACTONEL) 35 MG tablet Take 1 tablet (35 mg total) by mouth once a week. osteoporosis 4 tablet 11  . Vitamin D, Ergocalciferol, (DRISDOL) 50000 units CAPS capsule Take 50,000 Units by mouth every 7 (seven) days.    . ONE TOUCH ULTRA TEST test strip 1 strip.  4   No facility-administered medications prior to visit.    Allergies  Allergen Reactions  . Codeine   . Penicillins     Review of Systems  Constitutional: Negative for chills, fever and malaise/fatigue.  HENT: Negative for congestion and hearing loss.   Eyes: Negative for discharge.  Respiratory: Negative for cough, sputum production and shortness of breath.   Cardiovascular: Negative for chest pain, palpitations and leg swelling.  Gastrointestinal: Negative for abdominal pain, blood in stool, constipation, diarrhea, heartburn, nausea and  vomiting.  Genitourinary: Negative for dysuria, frequency, hematuria and urgency.  Musculoskeletal: Negative for back pain, falls and myalgias.  Skin: Negative for rash.  Neurological: Negative for dizziness, sensory change, loss of consciousness, weakness and headaches.  Endo/Heme/Allergies: Negative for environmental allergies. Does not bruise/bleed easily.  Psychiatric/Behavioral: Negative for depression and suicidal ideas. The patient is not nervous/anxious and does  not have insomnia.        Objective:    Physical Exam Constitutional:      Appearance: Normal appearance. She is not ill-appearing.  HENT:     Head: Normocephalic and atraumatic.  Eyes:     General:        Right eye: No discharge.        Left eye: No discharge.  Pulmonary:     Effort: Pulmonary effort is normal.  Neurological:     Mental Status: She is alert and oriented to person, place, and time.  Psychiatric:        Behavior: Behavior normal.     BP 110/70   Wt 105 lb (47.6 kg)   LMP 01/14/2011   BMI 20.17 kg/m  Wt Readings from Last 3 Encounters:  05/03/19 105 lb (47.6 kg)  04/13/19 108 lb 3.2 oz (49.1 kg)  12/09/18 105 lb (47.6 kg)    Diabetic Foot Exam - Simple   No data filed     Lab Results  Component Value Date   WBC 10.3 01/16/2017   HGB 13.5 01/16/2017   HCT 40.8 01/16/2017   PLT 277.0 01/16/2017   GLUCOSE 90 01/16/2017   CHOL 195 01/16/2017   TRIG 66.0 01/16/2017   HDL 65.30 01/16/2017   LDLCALC 117 (H) 01/16/2017   ALT 9 01/16/2017   AST 14 01/16/2017   NA 138 01/16/2017   K 4.4 01/16/2017   CL 102 01/16/2017   CREATININE 0.79 01/16/2017   BUN 19 01/16/2017   CO2 29 01/16/2017   HGBA1C 5.7 01/16/2017    No results found for: TSH Lab Results  Component Value Date   WBC 10.3 01/16/2017   HGB 13.5 01/16/2017   HCT 40.8 01/16/2017   MCV 89.6 01/16/2017   PLT 277.0 01/16/2017   Lab Results  Component Value Date   NA 138 01/16/2017   K 4.4 01/16/2017   CO2 29  01/16/2017   GLUCOSE 90 01/16/2017   BUN 19 01/16/2017   CREATININE 0.79 01/16/2017   BILITOT 0.6 01/16/2017   ALKPHOS 60 01/16/2017   AST 14 01/16/2017   ALT 9 01/16/2017   PROT 7.2 01/16/2017   ALBUMIN 4.4 01/16/2017   CALCIUM 9.6 01/16/2017   GFR 80.65 01/16/2017   Lab Results  Component Value Date   CHOL 195 01/16/2017   Lab Results  Component Value Date   HDL 65.30 01/16/2017   Lab Results  Component Value Date   LDLCALC 117 (H) 01/16/2017   Lab Results  Component Value Date   TRIG 66.0 01/16/2017   Lab Results  Component Value Date   CHOLHDL 3 01/16/2017   Lab Results  Component Value Date   HGBA1C 5.7 01/16/2017       Assessment & Plan:   Problem List Items Addressed This Visit    Vitamin D deficiency    Supplement and monitor      MIXED HYPERLIPIDEMIA    Encouraged heart healthy diet, increase exercise, avoid trans fats. She will be getting labs done at her endocrinologist soon and she will forward [place] the results      Osteoporosis - Primary    Dexa Scan ordered. Encouraged to get adequate exercise, calcium and vitamin d intake.      Relevant Orders   DG Bone Density   Hypothyroidism    On Levothyroxine, continue to monitor      Diabetes type 2, controlled ([place])    Follows with endocrinology and has labs  ordered soon.       Preventative health care    Patient encouraged to maintain heart healthy diet, regular exercise, adequate sleep. Consider daily probiotics. Take medications as prescribed. MGM and Dexa scan are ordered. Labs reviewed. Discussed COVID vaccine and she will take when she is eligible.       Vitamin B12 deficiency    Supplement and monitor      Varicose vein of leg    Knee area, she is wearing compression hose and is considering seeking care with vascular surgery she will let us know if she wants a referral       Other Visit Diagnoses    Encounter for screening for malignant neoplasm of breast, unspecified screening  modality       Relevant Orders   MM 3D SCREEN BREAST BILATERAL      I am having Lynnette Higginson maintain her ONE TOUCH ULTRA TEST, levothyroxine, Vitamin D (Ergocalciferol), risedronate, ALPRAZolam, estradiol, estradiol, and progesterone.  No orders of the defined types were placed in this encounter.    I discussed the assessment and treatment plan with the patient. The patient was provided an opportunity to ask questions and all were answered. The patient agreed with the plan and demonstrated an understanding of the instructions.   The patient was advised to call back or seek an in-person evaluation if the symptoms worsen or if the condition fails to improve as anticipated.  I provided 30 minutes of non-face-to-face time during this encounter.   Danise Edge, MD

## 2019-05-04 NOTE — Assessment & Plan Note (Signed)
On Levothyroxine, continue to monitor 

## 2019-05-04 NOTE — Assessment & Plan Note (Signed)
Patient encouraged to maintain heart healthy diet, regular exercise, adequate sleep. Consider daily probiotics. Take medications as prescribed. MGM and Dexa scan are ordered. Labs reviewed. Discussed COVID vaccine and she will take when she is eligible.

## 2019-05-04 NOTE — Assessment & Plan Note (Signed)
Supplement and monitor 

## 2019-05-07 ENCOUNTER — Ambulatory Visit: Payer: 59 | Attending: Internal Medicine

## 2019-05-07 DIAGNOSIS — Z23 Encounter for immunization: Secondary | ICD-10-CM

## 2019-05-07 NOTE — Progress Notes (Signed)
   Covid-19 Vaccination Clinic  Name:  Kelli Barry    MRN: [id number] DOB: 12-05-1965  05/07/2019  Kelli Barry was observed post Covid-19 immunization for 15 minutes without incident. She was provided with Vaccine Information Sheet and instruction to access the V-Safe system.   Kelli Barry was instructed to call 911 with any severe reactions post vaccine: Marland Kitchen Difficulty breathing  . Swelling of face and throat  . A fast heartbeat  . A bad rash all over body  . Dizziness and weakness   Immunizations Administered    Name Date Dose VIS Date Route   Moderna COVID-19 Vaccine 05/07/2019 12:12 PM 0.5 mL 01/18/2019 Intramuscular   Manufacturer: Moderna   Lot: [id number]   NDC: 81829-937-16

## 2019-06-06 ENCOUNTER — Other Ambulatory Visit: Payer: Self-pay

## 2019-06-06 ENCOUNTER — Encounter: Payer: Self-pay | Admitting: Cardiovascular Disease

## 2019-06-06 ENCOUNTER — Ambulatory Visit (INDEPENDENT_AMBULATORY_CARE_PROVIDER_SITE_OTHER): Payer: 59 | Admitting: Cardiovascular Disease

## 2019-06-06 VITALS — BP 105/67 | HR 57 | Ht 62.0 in | Wt 106.0 lb

## 2019-06-06 DIAGNOSIS — E119 Type 2 diabetes mellitus without complications: Secondary | ICD-10-CM

## 2019-06-06 DIAGNOSIS — E782 Mixed hyperlipidemia: Secondary | ICD-10-CM | POA: Diagnosis not present

## 2019-06-06 DIAGNOSIS — Z0000 Encounter for general adult medical examination without abnormal findings: Secondary | ICD-10-CM

## 2019-06-06 NOTE — Patient Instructions (Signed)
Medication Instructions:  Your physician recommends that you continue on your current medications as directed. Please refer to the Current Medication list given to you today.  *If you need a refill on your cardiac medications before your next appointment, please call your pharmacy*  Lab Work: WILL REQUEST FROM [facility]   Testing/Procedures: NONE   Follow-Up: At [facility], you and your health needs are our priority.  As part of our continuing mission to provide you with exceptional heart care, we have created designated Provider Care Teams.  These Care Teams include your primary Cardiologist (physician) and Advanced Practice Providers (APPs -  Physician Assistants and Nurse Practitioners) who all work together to provide you with the care you need, when you need it.  We recommend signing up for the patient portal called "MyChart".  Sign up information is provided on this After Visit Summary.  MyChart is used to connect with patients for Virtual Visits (Telemedicine).  Patients are able to view lab/test results, encounter notes, upcoming appointments, etc.  Non-urgent messages can be sent to your provider as well.   To learn more about what you can do with MyChart, go to [email or web address].    Your next appointment:   12 month(s)  The format for your next appointment:   In Person  Provider:   You may see DR [facility]  or one of the following Advanced Practice Providers on your designated Care Team:    Corine Shelter, PA-C  [place], [place]  Edd Fabian, [place]

## 2019-06-06 NOTE — Progress Notes (Signed)
Cardiology Office Note   Date:  06/06/2019   ID:  Kelli Barry, DOB Jun 08, 1965, MRN [id number]  PCP:  Kelli Canary, MD  Cardiologist:   Kelli Si, MD   No chief complaint on file.    History of Present Illness: Kelli Barry is a 54 y.o. female with diabetes, hyperlipidemia, and scoliosis for follow-up.  She was initially seen in 2018 for the evaluation of family history of CAD and prior to that in 2016 for atypical chest pain.  Her mother had a stroke and was diagnosed with atrial fibrillation.  Therefore she followed up in 2018 for a checkup.  Overall she was feeling well.  She was referred for a cardiac calcium score that revealed no plaque in a score of 0.  Since then she has been doing well.  Her only complaint is throbbing in her right leg attributable to varicose veins.  She wears a compression sock and this seems to help.  She plans to follow-up with vein and vascular.  She is walking daily for 25 minutes.  She is able to walk about 3-4 miles at this time.  She has no exertional chest pain and no shortness of breath. She denies lower extremity edema, orthopnea, of PND.  She has been working home due to the pandemic.  She struggles with her diet.  She has been very stressed than last year.  She lost her father and had a divorce.  She occasionally has palpitations only when feeling stressed.  Past Medical History:  Diagnosis Date  . Abdominal pain 08/06/2014  . Acute bronchitis 12/15/2010  . Allergy    seasonal  . Anxiety 10/16/2016  . Diabetes mellitus without complication (HCC)    diet controlled  . Diabetes type 2, controlled (HCC) 08/06/2014   Follows with Dr Talmage Nap  . Hyperlipidemia   . Hypothyroidism 08/06/2014  . Osteoporosis   . Osteoporosis 12/05/2009   Qualifier: Diagnosis of  By: Abner Greenspan MD, Misty Stanley    . Preventative health care 08/06/2014  . Rash 10/16/2016    Past Surgical History:  Procedure Laterality Date  . NO PAST SURGERIES       Current  Outpatient Medications  Medication Sig Dispense Refill  . ALPRAZolam (XANAX) 0.25 MG tablet Take 1 tablet (0.25 mg total) by mouth 2 (two) times daily as needed for anxiety. 30 tablet 1  . estradiol (ESTRACE VAGINAL) 0.1 MG/GM vaginal cream Place 0.25 Applicatorfuls vaginally 2 (two) times a week. Apply a thin layer on vulva 2 times a week as needed. 42.5 g 4  . estradiol (VIVELLE-DOT) 0.05 MG/24HR patch Place 1 patch (0.05 mg total) onto the skin 2 (two) times a week. 24 patch 4  . levothyroxine (SYNTHROID, LEVOTHROID) 75 MCG tablet Take 75 mcg by mouth daily before breakfast.    . ONE TOUCH ULTRA TEST test strip 1 strip.  4  . progesterone (PROMETRIUM) 100 MG capsule Take 1 capsule (100 mg total) by mouth at bedtime. 90 capsule 4  . risedronate (ACTONEL) 35 MG tablet Take 1 tablet (35 mg total) by mouth once a week. osteoporosis 4 tablet 11  . Vitamin D, Ergocalciferol, (DRISDOL) 50000 units CAPS capsule Take 50,000 Units by mouth every 7 (seven) days.     No current facility-administered medications for this visit.    Allergies:   Codeine and Penicillins    Social History:  The patient  reports that she has never smoked. She has never used smokeless tobacco. She reports current alcohol use  of about 2.0 standard drinks of alcohol per week. She reports that she does not use drugs.   Family History:  The patient's family history includes Allergies in her son; Atrial fibrillation in her mother; Cancer in her maternal grandmother and paternal grandfather; Colon cancer (age of onset: 23) in her maternal grandmother; Coronary artery disease in her maternal grandmother; Dementia in her father, mother, and paternal grandmother; Diabetes in her father; Emphysema in her paternal grandfather; Fibroids in her mother; Heart attack in her maternal grandmother; Heart disease in her maternal grandfather; Heart disease (age of onset: 41) in her maternal uncle; Heart disease (age of onset: 25) in her maternal  uncle; Heart disease (age of onset: 20) in her maternal aunt; Hyperlipidemia in her mother; Hypertension in her father and mother; Melanoma in her father; Memory loss in her father; Ovarian cysts in her sister; Parkinson's disease in her father; Parkinsonism in her father; Stroke in her mother and paternal grandmother.    ROS:  Please see the history of present illness.   Otherwise, review of systems are positive for none.   All other systems are reviewed and negative.    PHYSICAL EXAM: VS:  BP 105/67   Pulse (!) 57   Ht 5\' 2"  (1.575 m)   Wt 106 lb (48.1 kg)   LMP 01/14/2011   SpO2 100%   BMI 19.39 kg/m  , BMI Body mass index is 19.39 kg/m. GENERAL:  Well appearing HEENT: Pupils equal round and reactive, fundi not visualized, oral mucosa unremarkable NECK:  No jugular venous distention, waveform within normal limits, carotid upstroke brisk and symmetric, no bruits LUNGS:  Clear to auscultation bilaterally HEART:  RRR.  PMI not displaced or sustained,S1 and S2 within normal limits, no S3, no S4, no clicks, no rubs, no murmurs ABD:  Flat, positive bowel sounds normal in frequency in pitch, no bruits, no rebound, no guarding, no midline pulsatile mass, no hepatomegaly, no splenomegaly EXT:  2 plus pulses throughout, no edema, no cyanosis no clubbing SKIN:  No rashes no nodules NEURO:  Cranial nerves II through XII grossly intact, motor grossly intact throughout PSYCH:  Cognitively intact, oriented to person place and time   EKG:  EKG is ordered today. The ekg ordered 01/14/17 demonstrates sinus rhythm.  Rate 70 bpm. 06/06/19: Sinus bradycardia.  Rate 57 bpm.  Low voltage.  Recent Labs: No results found for requested labs within last 8760 hours.    Lipid Panel    Component Value Date/Time   CHOL 195 01/16/2017 0913   TRIG 66.0 01/16/2017 0913   HDL 65.30 01/16/2017 0913   CHOLHDL 3 01/16/2017 0913   VLDL 13.2 01/16/2017 0913   LDLCALC 117 (H) 01/16/2017 0913      Wt  Readings from Last 3 Encounters:  06/06/19 106 lb (48.1 kg)  05/03/19 105 lb (47.6 kg)  04/13/19 108 lb 3.2 oz (49.1 kg)      ASSESSMENT AND PLAN:  # Family history of CAD: # Hyperlipidemia:  Ms. Baham is asymptomatic from a coronary standpoint.  Calcium score was 0.  Her ASCVD 10-year risk has been low despite her diabetes.  She likes to avoid statins given her family history of dementia.  I think this is reasonable as long as her ASCVD 10-year risk remains low.  Continue to work on diet and exercise.  We will get a copy of her most recent lipids.  # Varicose vein: Follow-up with vein and vascular as planned.  Current medicines are reviewed  at length with the patient today.  The patient does not have concerns regarding medicines.  The following changes have been made:  no change  Labs/ tests ordered today include:   No orders of the defined types were placed in this encounter.    Disposition:   FU with Samit Sylve C. Oval Linsey, MD, [facility] in 1 years     Signed, Jonelle Sidle C. Oval Linsey, MD, [facility]  06/06/2019 1:00 PM    [place]

## 2019-06-08 ENCOUNTER — Ambulatory Visit: Payer: 59 | Attending: Internal Medicine

## 2019-06-08 DIAGNOSIS — Z23 Encounter for immunization: Secondary | ICD-10-CM

## 2019-06-08 NOTE — Progress Notes (Signed)
   Covid-19 Vaccination Clinic  Name:  Kelli Barry    MRN: [id number] DOB: [birth year]  06/08/2019  [name] was observed post Covid-19 immunization for 30 minutes based on pre-vaccination screening without incident. She was provided with Vaccine Information Sheet and instruction to access the V-Safe system.   [name] was instructed to call 911 with any severe reactions post vaccine: Marland Kitchen Difficulty breathing  . Swelling of face and throat  . A fast heartbeat  . A bad rash all over body  . Dizziness and weakness   Immunizations Administered    Name Date Dose VIS Date Route   Moderna COVID-19 Vaccine 06/08/2019 11:27 AM 0.5 mL 01/2019 Intramuscular   Manufacturer: Moderna   Lot: [id number]   NDC: 36438-377-93

## 2019-06-17 ENCOUNTER — Ambulatory Visit (HOSPITAL_BASED_OUTPATIENT_CLINIC_OR_DEPARTMENT_OTHER)
Admission: RE | Admit: 2019-06-17 | Discharge: 2019-06-17 | Disposition: A | Discharge status: Home / Self Care | Payer: 59 | Source: Ambulatory Visit | Attending: Family Medicine | Admitting: Family Medicine

## 2019-06-17 ENCOUNTER — Ambulatory Visit (HOSPITAL_BASED_OUTPATIENT_CLINIC_OR_DEPARTMENT_OTHER): Payer: 59

## 2019-06-17 ENCOUNTER — Other Ambulatory Visit: Payer: Self-pay

## 2019-06-17 DIAGNOSIS — Z1239 Encounter for other screening for malignant neoplasm of breast: Secondary | ICD-10-CM | POA: Diagnosis present

## 2019-06-17 DIAGNOSIS — M810 Age-related osteoporosis without current pathological fracture: Secondary | ICD-10-CM | POA: Diagnosis not present

## 2019-08-29 ENCOUNTER — Other Ambulatory Visit: Payer: Self-pay | Admitting: Obstetrics & Gynecology

## 2019-10-25 ENCOUNTER — Telehealth: Payer: Self-pay | Admitting: Family Medicine

## 2019-10-25 NOTE — Telephone Encounter (Signed)
Medication: risedronate (ACTONEL) 35 MG tablet [[id number]]    ALPRAZolam (XANAX) 0.25 MG tablet [[id number]]   Has the patient contacted their pharmacy? No. (If no, request that the patient contact the pharmacy for the refill.) (If yes, when and what did the pharmacy advise?)  Preferred Pharmacy (with phone number or street name): [id number] - [place], [place] - [address]  [address], [place] [address]  Phone:  [phone] Fax:  [phone]  DEA #:  [id number]  Agent: Please be advised that RX refills may take up to 3 business days. We ask that you follow-up with your pharmacy.

## 2019-12-09 ENCOUNTER — Telehealth (INDEPENDENT_AMBULATORY_CARE_PROVIDER_SITE_OTHER): Payer: 59 | Admitting: Family Medicine

## 2019-12-09 ENCOUNTER — Other Ambulatory Visit: Payer: Self-pay

## 2019-12-09 DIAGNOSIS — E119 Type 2 diabetes mellitus without complications: Secondary | ICD-10-CM

## 2019-12-09 DIAGNOSIS — E559 Vitamin D deficiency, unspecified: Secondary | ICD-10-CM

## 2019-12-09 DIAGNOSIS — E039 Hypothyroidism, unspecified: Secondary | ICD-10-CM | POA: Diagnosis not present

## 2019-12-09 DIAGNOSIS — E782 Mixed hyperlipidemia: Secondary | ICD-10-CM | POA: Diagnosis not present

## 2019-12-09 DIAGNOSIS — F419 Anxiety disorder, unspecified: Secondary | ICD-10-CM

## 2019-12-09 DIAGNOSIS — E538 Deficiency of other specified B group vitamins: Secondary | ICD-10-CM

## 2019-12-09 MED ORDER — RISEDRONATE SODIUM 35 MG PO TABS: 35.0000 mg | ORAL_TABLET | ORAL | 11 refills | Status: DC

## 2019-12-09 MED ORDER — ALPRAZOLAM 0.25 MG PO TABS: 0.2500 mg | ORAL_TABLET | Freq: Two times a day (BID) | ORAL | 3 refills | Status: DC | PRN

## 2019-12-09 NOTE — Patient Instructions (Signed)
needs a nurse visit next week for flu shot, regular   a CPE in 6 months with Abner Greenspan

## 2019-12-11 NOTE — Progress Notes (Signed)
Virtual Visit via Video Note  I connected with Angila Feinstein on 12/09/19 at  9:00 AM EDT by a video enabled telemedicine application and verified that I am speaking with the correct person using two identifiers.  Location: Patient: home, patient and provider in visit Provider: home   I discussed the limitations of evaluation and management by telemedicine and the availability of in person appointments. The patient expressed understanding and agreed to proceed. S Chism, CMA was able to set the patient up on a video visit   Subjective:    Patient ID: Kelli Barry, female    DOB: 01/23/1966, 54 y.o.   MRN: [id number]  Chief Complaint  Patient presents with  . Follow-up    HPI Patient is in today for follow up on chronic medical concerns. No recent febrile illness or hospitalizations. She continues to care for her ill mother and is under a great deal of stress. She uses Alprazolam prn with good results and she denies any concerning side effects. She is using Melatonin for sleep and that is helpful at times. She is following with endocrinology and she reports her last hgba1c was 5.6. no polyuria or polydipsia. Denies CP/palp/SOB/HA/congestion/fevers/GI or GU c/o. Taking meds as prescribed  Past Medical History:  Diagnosis Date  . Abdominal pain 08/06/2014  . Acute bronchitis 12/15/2010  . Allergy    seasonal  . Anxiety 10/16/2016  . Diabetes mellitus without complication (HCC)    diet controlled  . Diabetes type 2, controlled (HCC) 08/06/2014   Follows with Dr Talmage Nap  . Hyperlipidemia   . Hypothyroidism 08/06/2014  . Osteoporosis   . Osteoporosis 12/05/2009   Qualifier: Diagnosis of  By: Abner Greenspan MD, Misty Stanley    . Preventative health care 08/06/2014  . Rash 10/16/2016    Past Surgical History:  Procedure Laterality Date  . NO PAST SURGERIES      Family History  Problem Relation Age of Onset  . Diabetes Father        DM II  . Hypertension Father   . Melanoma Father   .  Dementia Father   . Memory loss Father   . Parkinsonism Father   . Parkinson's disease Father        lewy body  . Hypertension Mother   . Fibroids Mother        uterine  . Hyperlipidemia Mother   . Stroke Mother   . Atrial fibrillation Mother   . Dementia Mother   . Cancer Maternal Grandmother        colon  . Coronary artery disease Maternal Grandmother   . Heart attack Maternal Grandmother   . Colon cancer Maternal Grandmother 75  . Heart disease Maternal Grandfather        MI at roughly 77  . Stroke Paternal Grandmother   . Dementia Paternal Grandmother   . Cancer Paternal Grandfather        Lung  . Emphysema Paternal Grandfather   . Ovarian cysts Sister   . Heart disease Maternal Aunt 72       MI  . Heart disease Maternal Uncle 59       ish  . Heart disease Maternal Uncle 32       ish, MI  . Allergies Son     Social History   Socioeconomic History  . Marital status: Married    Spouse name: Not on file  . Number of children: Not on file  . Years of education: Not on file  . Highest  education level: Not on file  Occupational History  . Not on file  Tobacco Use  . Smoking status: Never Smoker  . Smokeless tobacco: Never Used  Vaping Use  . Vaping Use: Never used  Substance and Sexual Activity  . Alcohol use: Yes    Alcohol/week: 2.0 standard drinks    Types: 2 Glasses of wine per week  . Drug use: No  . Sexual activity: Not Currently    Birth control/protection: Post-menopausal    Comment: 1st intercourse- 5, partner- 1, married- 32 yrs   Other Topics Concern  . Not on file  Social History Narrative  . Not on file   Social Determinants of Health   Financial Resource Strain:   . Difficulty of Paying Living Expenses: Not on file  Food Insecurity:   . Worried About Programme researcher, broadcasting/film/video in the Last Year: Not on file  . Ran Out of Food in the Last Year: Not on file  Transportation Needs:   . Lack of Transportation (Medical): Not on file  . Lack of  Transportation (Non-Medical): Not on file  Physical Activity:   . Days of Exercise per Week: Not on file  . Minutes of Exercise per Session: Not on file  Stress:   . Feeling of Stress : Not on file  Social Connections:   . Frequency of Communication with Friends and Family: Not on file  . Frequency of Social Gatherings with Friends and Family: Not on file  . Attends Religious Services: Not on file  . Active Member of Clubs or Organizations: Not on file  . Attends Banker Meetings: Not on file  . Marital Status: Not on file  Intimate Partner Violence:   . Fear of Current or Ex-Partner: Not on file  . Emotionally Abused: Not on file  . Physically Abused: Not on file  . Sexually Abused: Not on file    Outpatient Medications Prior to Visit  Medication Sig Dispense Refill  . estradiol (ESTRACE VAGINAL) 0.1 MG/GM vaginal cream Place 0.25 Applicatorfuls vaginally 2 (two) times a week. Apply a thin layer on vulva 2 times a week as needed. 42.5 g 4  . estradiol (VIVELLE-DOT) 0.05 MG/24HR patch PLACE 1 PATCH (0.05 MG TOTAL) ONTO THE SKIN 2 (TWO) TIMES A WEEK. 24 patch 1  . levothyroxine (SYNTHROID, LEVOTHROID) 75 MCG tablet Take 75 mcg by mouth daily before breakfast.    . ONE TOUCH ULTRA TEST test strip 1 strip.  4  . progesterone (PROMETRIUM) 100 MG capsule Take 1 capsule (100 mg total) by mouth at bedtime. 90 capsule 4  . Vitamin D, Ergocalciferol, (DRISDOL) 50000 units CAPS capsule Take 50,000 Units by mouth every 7 (seven) days.    . ALPRAZolam (XANAX) 0.25 MG tablet Take 1 tablet (0.25 mg total) by mouth 2 (two) times daily as needed for anxiety. 30 tablet 1  . risedronate (ACTONEL) 35 MG tablet Take 1 tablet (35 mg total) by mouth once a week. osteoporosis 4 tablet 11   No facility-administered medications prior to visit.    Allergies  Allergen Reactions  . Codeine   . Penicillins     Review of Systems  Constitutional: Negative for fever and malaise/fatigue.  HENT:  Negative for congestion.   Eyes: Negative for blurred vision.  Respiratory: Negative for shortness of breath.   Cardiovascular: Negative for chest pain, palpitations and leg swelling.  Gastrointestinal: Negative for abdominal pain, blood in stool and nausea.  Genitourinary: Negative for dysuria and frequency.  Musculoskeletal: Negative for falls.  Skin: Negative for rash.  Neurological: Negative for dizziness, loss of consciousness and headaches.  Endo/Heme/Allergies: Negative for environmental allergies.  Psychiatric/Behavioral: Negative for depression. The patient is nervous/anxious and has insomnia.        Objective:    Physical Exam Constitutional:      Appearance: Normal appearance. She is not ill-appearing.  HENT:     Head: Normocephalic and atraumatic.     Right Ear: External ear normal.     Left Ear: External ear normal.     Nose: Nose normal.  Eyes:     General:        Right eye: No discharge.        Left eye: No discharge.  Pulmonary:     Effort: Pulmonary effort is normal.  Neurological:     Mental Status: She is alert and oriented to person, place, and time.  Psychiatric:        Behavior: Behavior normal.     LMP 01/14/2011  Wt Readings from Last 3 Encounters:  06/06/19 106 lb (48.1 kg)  05/03/19 105 lb (47.6 kg)  04/13/19 108 lb 3.2 oz (49.1 kg)    Diabetic Foot Exam - Simple   No data filed     Lab Results  Component Value Date   WBC 10.3 01/16/2017   HGB 13.5 01/16/2017   HCT 40.8 01/16/2017   PLT 277.0 01/16/2017   GLUCOSE 90 01/16/2017   CHOL 195 01/16/2017   TRIG 66.0 01/16/2017   HDL 65.30 01/16/2017   LDLCALC 117 (H) 01/16/2017   ALT 9 01/16/2017   AST 14 01/16/2017   NA 138 01/16/2017   K 4.4 01/16/2017   CL 102 01/16/2017   CREATININE 0.79 01/16/2017   BUN 19 01/16/2017   CO2 29 01/16/2017   HGBA1C 5.7 01/16/2017    No results found for: TSH Lab Results  Component Value Date   WBC 10.3 01/16/2017   HGB 13.5 01/16/2017    HCT 40.8 01/16/2017   MCV 89.6 01/16/2017   PLT 277.0 01/16/2017   Lab Results  Component Value Date   NA 138 01/16/2017   K 4.4 01/16/2017   CO2 29 01/16/2017   GLUCOSE 90 01/16/2017   BUN 19 01/16/2017   CREATININE 0.79 01/16/2017   BILITOT 0.6 01/16/2017   ALKPHOS 60 01/16/2017   AST 14 01/16/2017   ALT 9 01/16/2017   PROT 7.2 01/16/2017   ALBUMIN 4.4 01/16/2017   CALCIUM 9.6 01/16/2017   GFR 80.65 01/16/2017   Lab Results  Component Value Date   CHOL 195 01/16/2017   Lab Results  Component Value Date   HDL 65.30 01/16/2017   Lab Results  Component Value Date   LDLCALC 117 (H) 01/16/2017   Lab Results  Component Value Date   TRIG 66.0 01/16/2017   Lab Results  Component Value Date   CHOLHDL 3 01/16/2017   Lab Results  Component Value Date   HGBA1C 5.7 01/16/2017       Assessment & Plan:   Problem List Items Addressed This Visit    Vitamin D deficiency    Supplement and monitor      MIXED HYPERLIPIDEMIA    Encouraged heart healthy diet, increase exercise, avoid trans fats, consider a krill oil cap daily      Hypothyroidism    On Levothyroxine, continue to monitor      Diabetes type 2, controlled (HCC)    hgba1c acceptable, minimize simple carbs. Increase exercise  as tolerated.       Anxiety    Continues to care for her declining mother and thus is under a great deal of stress. She is given a refill on her Alprazolam which works well when she uses it infrequently.       Relevant Medications   ALPRAZolam (XANAX) 0.25 MG tablet   Vitamin B12 deficiency    Supplement and monitor         I am having Quintessa Woolridge maintain her ONE TOUCH ULTRA TEST, levothyroxine, Vitamin D (Ergocalciferol), estradiol, progesterone, estradiol, risedronate, and ALPRAZolam.  Meds ordered this encounter  Medications  . risedronate (ACTONEL) 35 MG tablet    Sig: Take 1 tablet (35 mg total) by mouth once a week. osteoporosis    Dispense:  4 tablet     Refill:  11  . ALPRAZolam (XANAX) 0.25 MG tablet    Sig: Take 1 tablet (0.25 mg total) by mouth 2 (two) times daily as needed for anxiety.    Dispense:  30 tablet    Refill:  3     I discussed the assessment and treatment plan with the patient. The patient was provided an opportunity to ask questions and all were answered. The patient agreed with the plan and demonstrated an understanding of the instructions.   The patient was advised to call back or seek an in-person evaluation if the symptoms worsen or if the condition fails to improve as anticipated.  I provided 20 minutes of non-face-to-face time during this encounter.   Danise Edge, MD

## 2019-12-11 NOTE — Assessment & Plan Note (Signed)
Encouraged heart healthy diet, increase exercise, avoid trans fats, consider a krill oil cap daily 

## 2019-12-11 NOTE — Assessment & Plan Note (Signed)
On Levothyroxine, continue to monitor 

## 2019-12-11 NOTE — Assessment & Plan Note (Signed)
hgba1c acceptable, minimize simple carbs. Increase exercise as tolerated.  

## 2019-12-11 NOTE — Assessment & Plan Note (Signed)
Continues to care for her declining mother and thus is under a great deal of stress. She is given a refill on her Alprazolam which works well when she uses it infrequently.

## 2019-12-11 NOTE — Assessment & Plan Note (Signed)
Supplement and monitor 

## 2019-12-23 ENCOUNTER — Ambulatory Visit: Payer: 59

## 2020-03-16 ENCOUNTER — Ambulatory Visit: Payer: Managed Care, Other (non HMO)

## 2020-04-13 ENCOUNTER — Other Ambulatory Visit: Payer: Self-pay

## 2020-04-13 ENCOUNTER — Encounter: Payer: Self-pay | Admitting: Obstetrics & Gynecology

## 2020-04-13 ENCOUNTER — Ambulatory Visit (INDEPENDENT_AMBULATORY_CARE_PROVIDER_SITE_OTHER): Payer: 59 | Admitting: Obstetrics & Gynecology

## 2020-04-13 VITALS — BP 112/70 | Ht 60.75 in | Wt 105.0 lb

## 2020-04-13 DIAGNOSIS — Z01419 Encounter for gynecological examination (general) (routine) without abnormal findings: Secondary | ICD-10-CM | POA: Diagnosis not present

## 2020-04-13 DIAGNOSIS — Z79890 Hormone replacement therapy (postmenopausal): Secondary | ICD-10-CM | POA: Diagnosis not present

## 2020-04-13 DIAGNOSIS — N393 Stress incontinence (female) (male): Secondary | ICD-10-CM

## 2020-04-13 DIAGNOSIS — M810 Age-related osteoporosis without current pathological fracture: Secondary | ICD-10-CM | POA: Diagnosis not present

## 2020-04-13 MED ORDER — ESTRADIOL 0.1 MG/GM VA CREA: 0.2500 | TOPICAL_CREAM | VAGINAL | 4 refills | Status: DC

## 2020-04-13 MED ORDER — ESTRADIOL 0.05 MG/24HR TD PTTW: 1.0000 | MEDICATED_PATCH | TRANSDERMAL | 4 refills | Status: DC

## 2020-04-13 MED ORDER — PROGESTERONE MICRONIZED 100 MG PO CAPS: 100.0000 mg | ORAL_CAPSULE | Freq: Every day | ORAL | 4 refills | Status: DC

## 2020-04-13 NOTE — Progress Notes (Signed)
Kelli Barry 1965-05-27 [id number]   History:    55 y.o. G2P2L2 Divorced.  Works in [employer].  VO:[id number] presenting for annual gyn exam   VOJ:[id number], well on hormone replacement therapyxabout 10 years. On Vivelle-Dot 0.05 and Prometrium 100 mg at bedtime. No PMB. No pelvic pain. Not currently sexually active. Prefers to continue with Estrace cream for vaginal dryness.  Mild SUI, doing Kegels. Breasts normal. Body mass index 20. Good fitness and healthy nutrition. On Actonel for osteoporosis, followed by Endocrino. Taking supplements of vitamin D and calcium. Health labs with family physician.  [place] 2016.  Past medical history,surgical history, family history and social history were all reviewed and documented in the EPIC chart.  Gynecologic History Patient's last menstrual period was 01/14/2011.  Obstetric History OB History  Gravida Para Term Preterm AB Living  2 2       2   SAB IAB Ectopic Multiple Live Births               # Outcome Date GA Lbr Len/2nd Weight Sex Delivery Anes PTL Lv  2 Para           1 Para              ROS: A ROS was performed and pertinent positives and negatives are included in the history.  GENERAL: No fevers or chills. HEENT: No change in vision, no earache, sore throat or sinus congestion. NECK: No pain or stiffness. CARDIOVASCULAR: No chest pain or pressure. No palpitations. PULMONARY: No shortness of breath, cough or wheeze. GASTROINTESTINAL: No abdominal pain, nausea, vomiting or diarrhea, melena or bright red blood per rectum. GENITOURINARY: No urinary frequency, urgency, hesitancy or dysuria. MUSCULOSKELETAL: No joint or muscle pain, no back pain, no recent trauma. DERMATOLOGIC: No rash, no itching, no lesions. ENDOCRINE: No polyuria, polydipsia, no heat or cold intolerance. No recent change in weight. HEMATOLOGICAL: No anemia or easy bruising or bleeding. NEUROLOGIC: No headache, seizures, numbness, tingling  or weakness. PSYCHIATRIC: No depression, no loss of interest in normal activity or change in sleep pattern.     Exam:   BP 112/70   Ht 5' 0.75" (1.543 m)   Wt 105 lb (47.6 kg)   LMP 01/14/2011   BMI 20.00 kg/m   Body mass index is 20 kg/m.  General appearance : Well developed well nourished female. No acute distress HEENT: Eyes: no retinal hemorrhage or exudates,  Neck supple, trachea midline, no carotid bruits, no thyroidmegaly Lungs: Clear to auscultation, no rhonchi or wheezes, or rib retractions  Heart: Regular rate and rhythm, no murmurs or gallops Breast:Examined in sitting and supine position were symmetrical in appearance, no palpable masses or tenderness,  no skin retraction, no nipple inversion, no nipple discharge, no skin discoloration, no axillary or supraclavicular lymphadenopathy Abdomen: no palpable masses or tenderness, no rebound or guarding Extremities: no edema or skin discoloration or tenderness  Pelvic: Vulva: Normal             Vagina: No gross lesions or discharge  Cervix: No gross lesions or discharge  Uterus  AV, normal size, shape and consistency, non-tender and mobile  Adnexa  Without masses or tenderness  Anus: Normal   Assessment/Plan:  55 y.o. female for annual exam   1. Well female exam with routine gynecological exam Normal gynecologic exam in menopause. Pap test in Feb 2021 was negative, no indication to repeat this year. Breast exam normal. Screening mammogram Apr 2021 was negative. Colonoscopy 2016. Health labs with family  physician. Body mass index 20. Continue with fitness and healthy nutrition.  2. Post-menopause on HRT (hormone replacement therapy) Well on the hormone replacement therapy. No contraindication to continue. Estradiol patch 0.05 twice weekly and Prometrium 100 mg p.o. at bedtime represcribed.  3. Age-related osteoporosis without current pathological fracture Stable osteoporosis on last bone density Apr 2021. Will continue  on Actonel. Vitamin D supplements, calcium intake of 1.5 g/day total and weightbearing physical activities on a regular basis to continue.  4. SUI (stress urinary incontinence, female) Mild stress urinary incontinence. Continue with Kegel exercises. Refer to physical therapy for pelvic floor reinforcement. Recommend continuing with estradiol cream a quarter of an applicator twice a week. Prescription sent to pharmacy.  Other orders - estradiol (VIVELLE-DOT) 0.05 MG/24HR patch; Place 1 patch (0.05 mg total) onto the skin 2 (two) times a week. - estradiol (ESTRACE VAGINAL) 0.1 MG/GM vaginal cream; Place 0.25 Applicatorfuls vaginally 2 (two) times a week. Apply a thin layer on vulva 2 times a week as needed. - progesterone (PROMETRIUM) 100 MG capsule; Take 1 capsule (100 mg total) by mouth at bedtime.  Genia Del MD, 9:08 AM 04/13/2020

## 2020-04-16 ENCOUNTER — Telehealth: Payer: Self-pay | Admitting: *Deleted

## 2020-04-16 DIAGNOSIS — N8184 Pelvic muscle wasting: Secondary | ICD-10-CM

## 2020-04-16 NOTE — Telephone Encounter (Signed)
-----   Message from Genia Del, MD sent at 04/13/2020  9:14 AM EST ----- Regarding: Refer to Physical Therapy for Pelvic Floor reinforcement SUI

## 2020-04-16 NOTE — Telephone Encounter (Signed)
Referral placed at [facility] physical therapy, they will call to schedule.

## 2020-04-19 NOTE — Telephone Encounter (Signed)
Pt has left message to call x1

## 2020-04-24 NOTE — Telephone Encounter (Signed)
[facility] called and left message again to schedule. Encounter to close.

## 2020-05-02 IMAGING — MG DIGITAL SCREENING BILATERAL MAMMOGRAM WITH TOMO AND CAD
8 series · 9 of 24 positions shown · non-contrast
Comparison: Previous exam(s).

CLINICAL DATA: Screening.

EXAM:
DIGITAL SCREENING BILATERAL MAMMOGRAM WITH TOMO AND CAD

[L CC synth-2D]
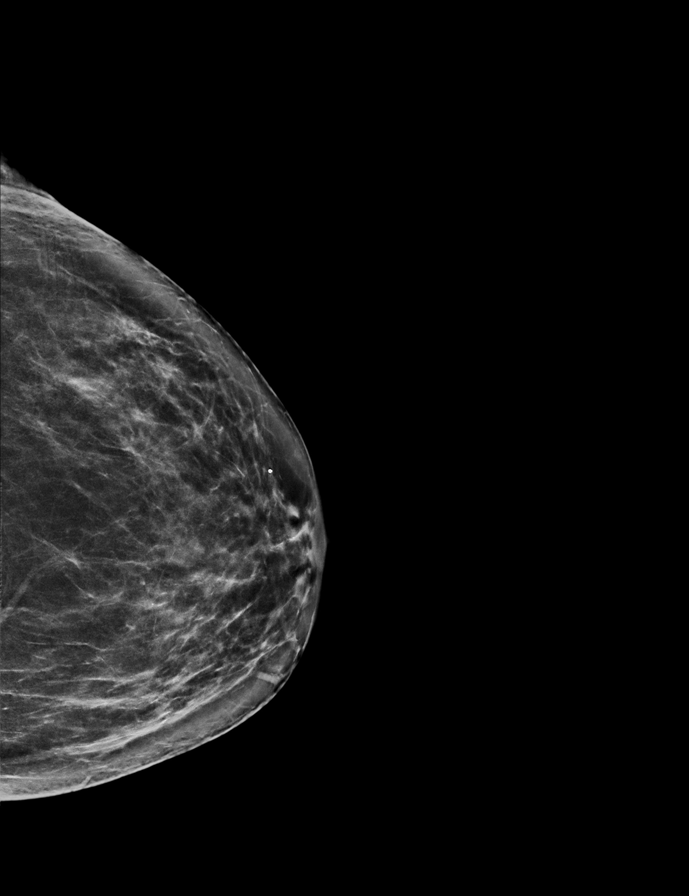

[R MLO synth-2D]
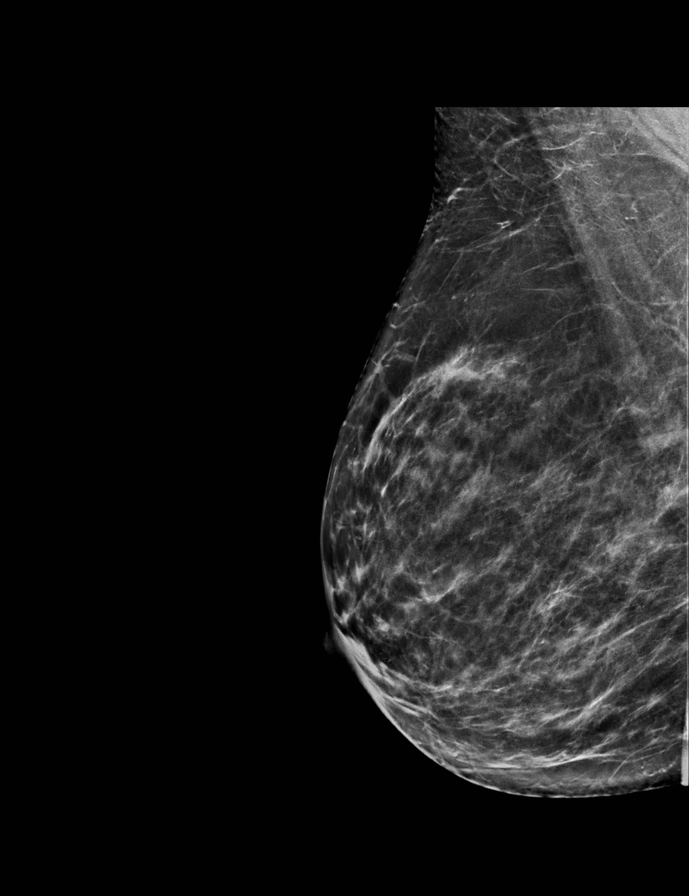

[R CC synth-2D]
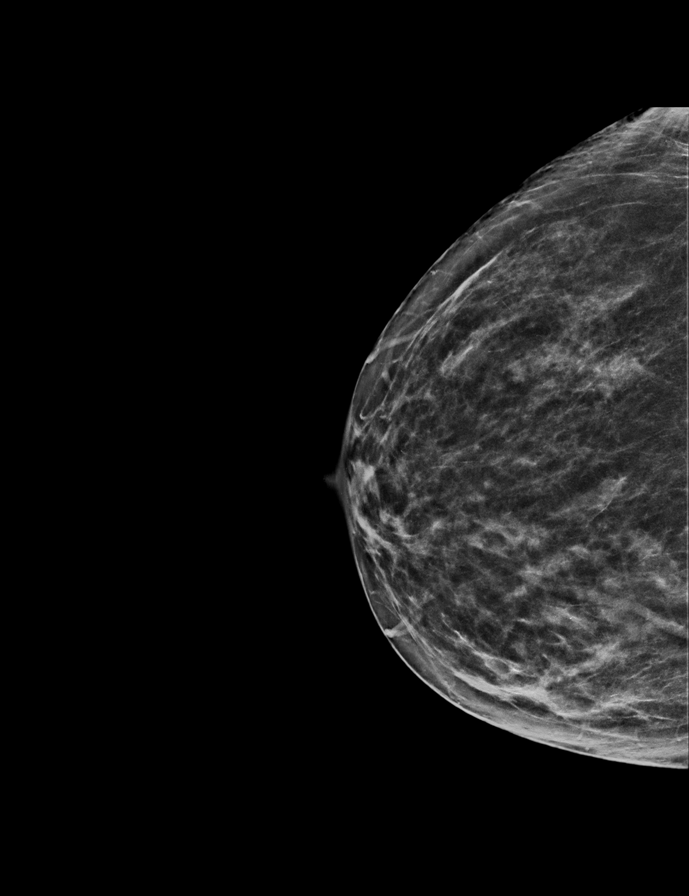

[L MLO synth-2D]
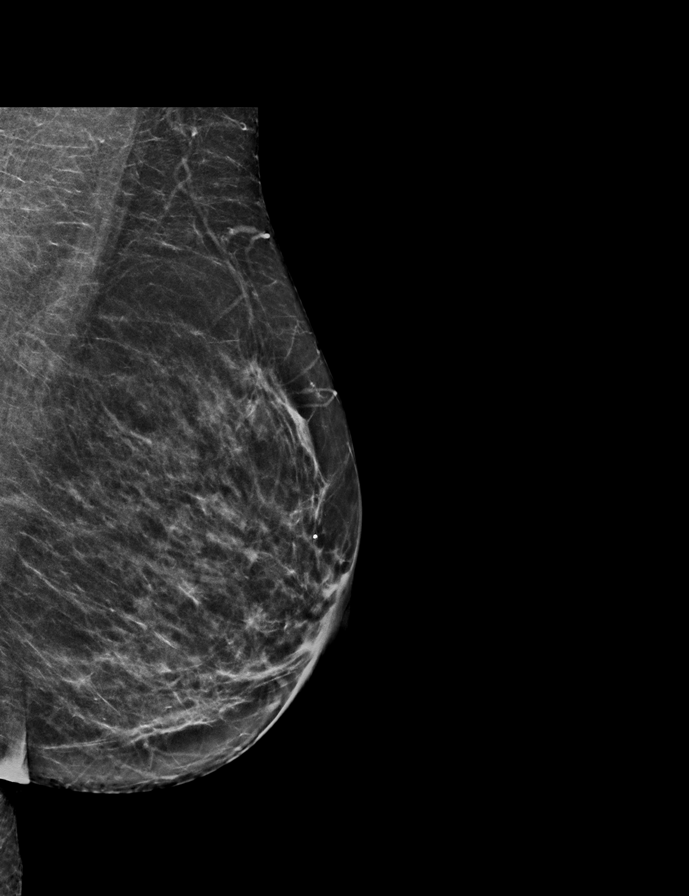

[L CC tomo · 2 of 66 frames shown]
[frame 22/66]
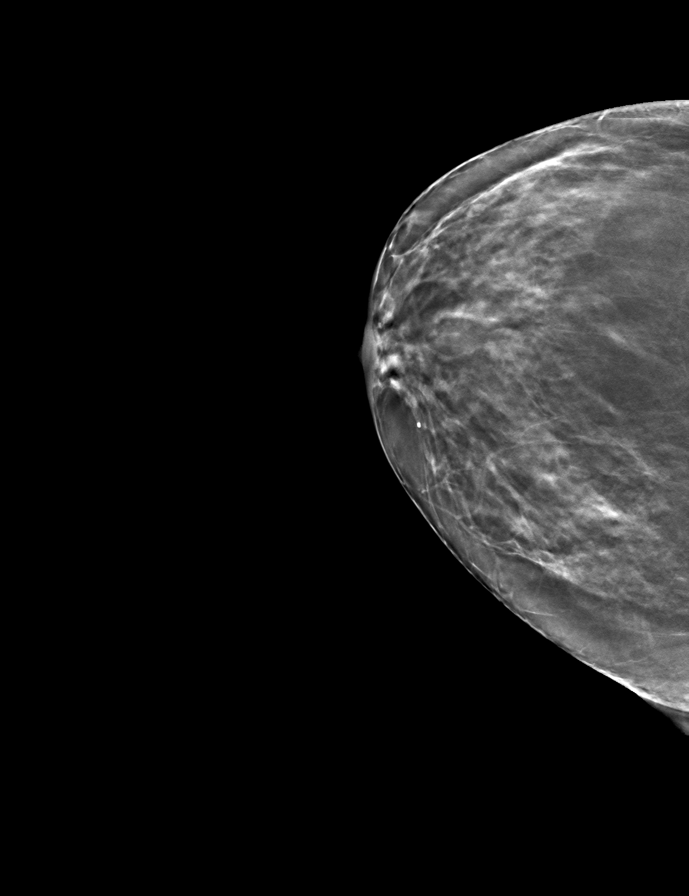
[frame 33/66]
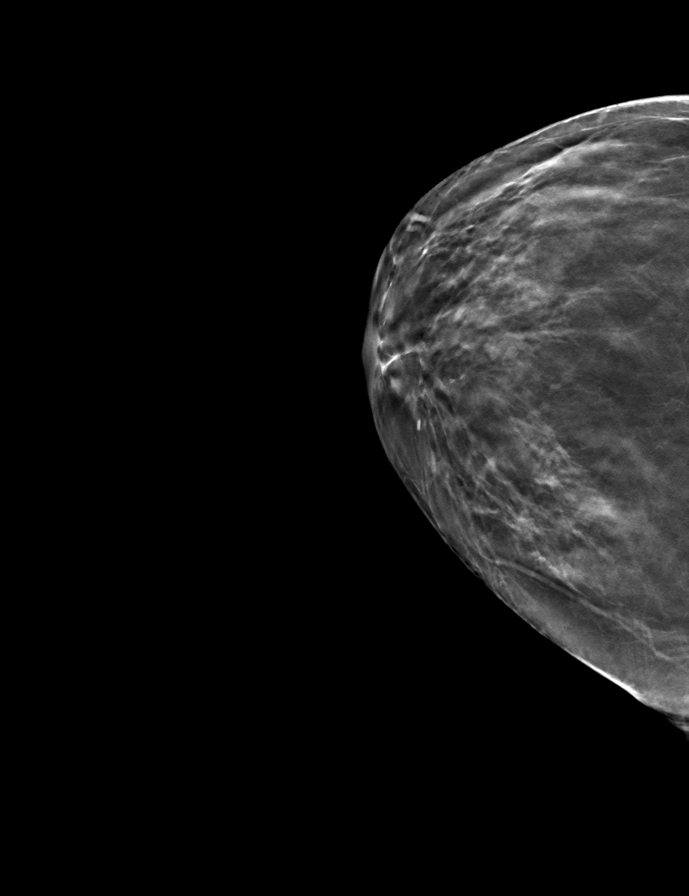

[L MLO tomo · tomo slice 29/57.0]
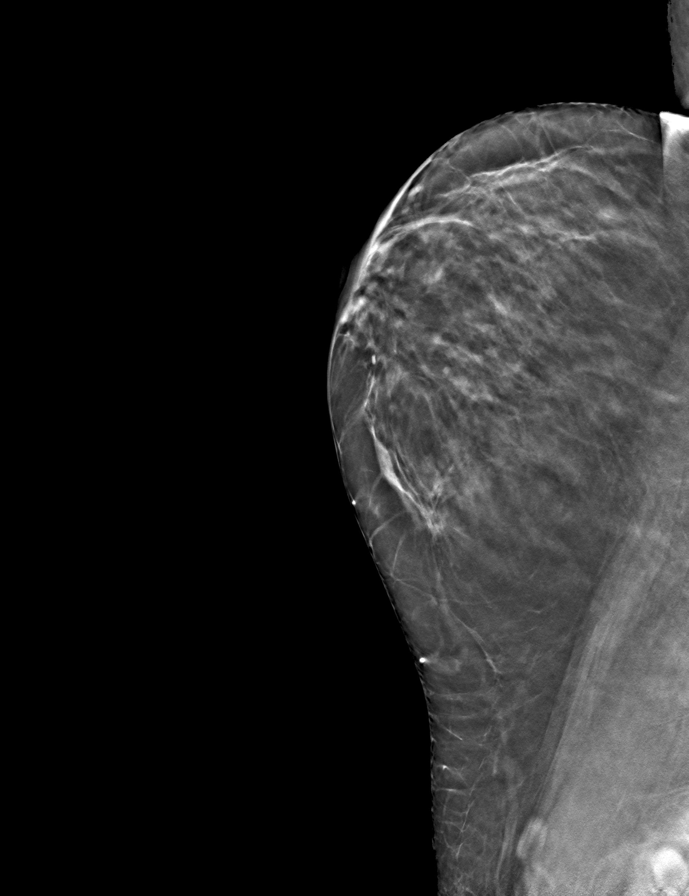

[R MLO tomo · tomo slice 33/64.0]
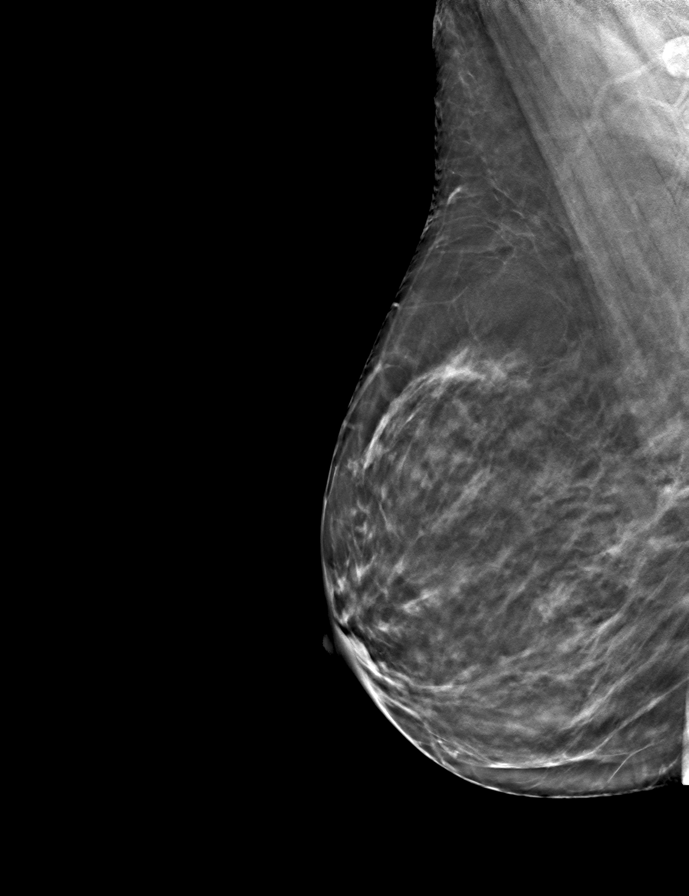

[R CC tomo · tomo slice 29/58.0]
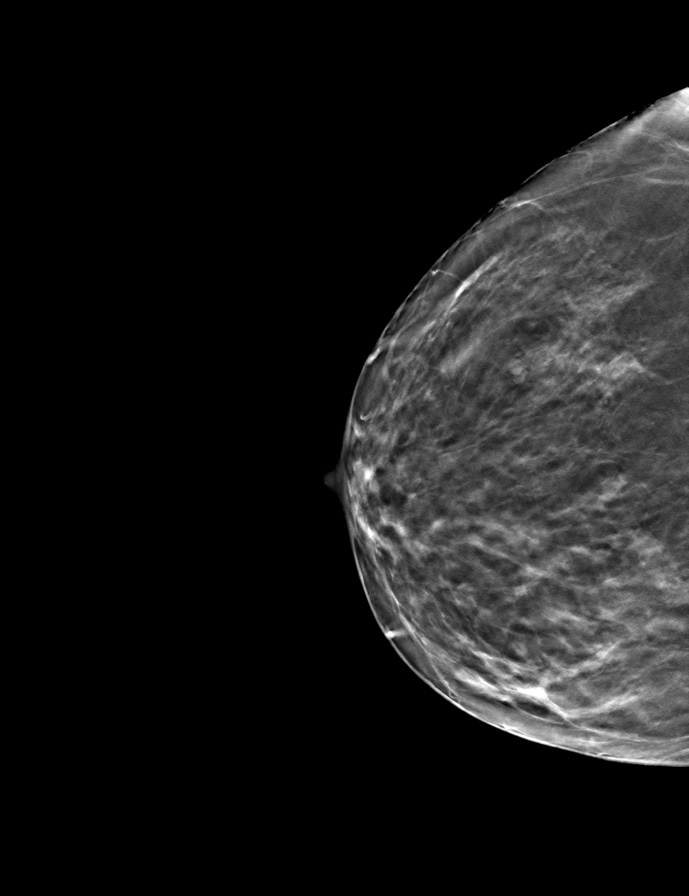

[9 of 24 positions shown; findings below may reference images not displayed]

ACR Breast Density Category b: There are scattered areas of
fibroglandular density.
FINDINGS: There are no findings suspicious for malignancy. Images were
processed with CAD.
IMPRESSION: No mammographic evidence of malignancy. A result letter of this
screening mammogram will be mailed directly to the patient.

RECOMMENDATION:
Screening mammogram in one year. (Code:[id number])

BI-RADS CATEGORY  1: Negative.

## 2020-05-12 LAB — COMPREHENSIVE METABOLIC PANEL: Calcium: 9.4 (ref 8.7–10.7)

## 2020-05-12 LAB — BASIC METABOLIC PANEL
BUN: 12 (ref 4–21)
CO2: 21 (ref 13–22)
Chloride: 103 (ref 99–108)
Glucose: 113
Potassium: 4.5 (ref 3.4–5.3)
Sodium: 142 (ref 137–147)

## 2020-05-12 LAB — LIPID PANEL
Cholesterol: 217 — AB (ref 0–200)
HDL: 86 — AB (ref 35–70)
LDL Cholesterol: 114
LDl/HDL Ratio: 2.5
Triglycerides: 87 (ref 40–160)

## 2020-05-12 LAB — VITAMIN D 25 HYDROXY (VIT D DEFICIENCY, FRACTURES): Vit D, 25-Hydroxy: 27.5

## 2020-05-12 LAB — HEMOGLOBIN A1C: Hemoglobin A1C: 5.8

## 2020-05-12 LAB — MICROALBUMIN, URINE: Microalb, Ur: 5.3

## 2020-05-12 LAB — TSH: TSH: 1.22 (ref 0.41–5.90)

## 2020-06-18 ENCOUNTER — Other Ambulatory Visit: Payer: Self-pay

## 2020-06-18 ENCOUNTER — Encounter: Payer: Self-pay | Admitting: Family Medicine

## 2020-06-18 ENCOUNTER — Ambulatory Visit (INDEPENDENT_AMBULATORY_CARE_PROVIDER_SITE_OTHER): Payer: 59 | Admitting: Family Medicine

## 2020-06-18 ENCOUNTER — Encounter: Payer: Self-pay | Admitting: *Deleted

## 2020-06-18 VITALS — BP 102/66 | HR 51 | Temp 98.3°F | Resp 16 | Ht 61.0 in | Wt 105.2 lb

## 2020-06-18 DIAGNOSIS — Z0000 Encounter for general adult medical examination without abnormal findings: Secondary | ICD-10-CM | POA: Diagnosis not present

## 2020-06-18 DIAGNOSIS — Z780 Asymptomatic menopausal state: Secondary | ICD-10-CM

## 2020-06-18 DIAGNOSIS — Z1231 Encounter for screening mammogram for malignant neoplasm of breast: Secondary | ICD-10-CM | POA: Diagnosis not present

## 2020-06-18 DIAGNOSIS — E782 Mixed hyperlipidemia: Secondary | ICD-10-CM | POA: Diagnosis not present

## 2020-06-18 DIAGNOSIS — E538 Deficiency of other specified B group vitamins: Secondary | ICD-10-CM

## 2020-06-18 DIAGNOSIS — F419 Anxiety disorder, unspecified: Secondary | ICD-10-CM | POA: Diagnosis not present

## 2020-06-18 DIAGNOSIS — M810 Age-related osteoporosis without current pathological fracture: Secondary | ICD-10-CM

## 2020-06-18 DIAGNOSIS — E559 Vitamin D deficiency, unspecified: Secondary | ICD-10-CM

## 2020-06-18 DIAGNOSIS — E119 Type 2 diabetes mellitus without complications: Secondary | ICD-10-CM | POA: Diagnosis not present

## 2020-06-18 DIAGNOSIS — M4120 Other idiopathic scoliosis, site unspecified: Secondary | ICD-10-CM

## 2020-06-18 MED ORDER — RISEDRONATE SODIUM 35 MG PO TABS: 35.0000 mg | ORAL_TABLET | ORAL | 11 refills | Status: DC

## 2020-06-18 MED ORDER — ALPRAZOLAM 0.25 MG PO TABS: 0.2500 mg | ORAL_TABLET | Freq: Two times a day (BID) | ORAL | 3 refills | Status: DC | PRN

## 2020-06-18 NOTE — Assessment & Plan Note (Signed)
has been under a great deal of stress the past 2 years and just placed her elderly mother with dementia  in assisted living last week. She is trying to sell her house to pay her expenses now and it is very stressful. Allowed a refill on Xanax to use prn

## 2020-06-18 NOTE — Assessment & Plan Note (Signed)
Noted to have spasm over SCM on left encouraged, Encouraged moist heat and gentle stretching as tolerated. May try NSAIDs and topical creams as directed and report if symptoms worsen or seek immediate care. Consider Taichi vs yoga vs PT and massage and chiropractic. Avoid surgery if at all possible

## 2020-06-18 NOTE — Assessment & Plan Note (Signed)
Supplement and monitor. She follows with endocrinology and they check

## 2020-06-18 NOTE — Assessment & Plan Note (Signed)
Encouraged to get adequate exercise, calcium and vitamin d intake. Labs requested she reports Vit D was 27.5 last month. Refill given on Actonel

## 2020-06-18 NOTE — Assessment & Plan Note (Signed)
Follows with GYN Dr Seymour Bars last pap 2021 will repeat in 2024.

## 2020-06-18 NOTE — Patient Instructions (Addendum)
Prevnar 20 is the shot you should get at the pharmacy  Shingrix is the new shingles shot, 2 shots over 2-6 months, confirm coverage with insurance and document, then can return here for shots with nurse appt or at pharmacy   Preventive Care 35-57 Years Old, Female Preventive care refers to lifestyle choices and visits with your health care provider that can promote health and wellness. This includes:  A yearly physical exam. This is also called an annual wellness visit.  Regular dental and eye exams.  Immunizations.  Screening for certain conditions.  Healthy lifestyle choices, such as: ? Eating a healthy diet. ? Getting regular exercise. ? Not using drugs or products that contain nicotine and tobacco. ? Limiting alcohol use. What can I expect for my preventive care visit? Physical exam Your health care provider will check your:  Height and weight. These may be used to calculate your BMI (body mass index). BMI is a measurement that tells if you are at a healthy weight.  Heart rate and blood pressure.  Body temperature.  Skin for abnormal spots. Counseling Your health care provider may ask you questions about your:  Past medical problems.  Family's medical history.  Alcohol, tobacco, and drug use.  Emotional well-being.  Home life and relationship well-being.  Sexual activity.  Diet, exercise, and sleep habits.  Work and work Statistician.  Access to firearms.  Method of birth control.  Menstrual cycle.  Pregnancy history. What immunizations do I need? Vaccines are usually given at various ages, according to a schedule. Your health care provider will recommend vaccines for you based on your age, medical history, and lifestyle or other factors, such as travel or where you work.   What tests do I need? Blood tests  Lipid and cholesterol levels. These may be checked every 5 years, or more often if you are over 59 years old.  Hepatitis C test.  Hepatitis B  test. Screening  Lung cancer screening. You may have this screening every year starting at age 70 if you have a 30-pack-year history of smoking and currently smoke or have quit within the past 15 years.  Colorectal cancer screening. ? All adults should have this screening starting at age 80 and continuing until age 97. ? Your health care provider may recommend screening at age 72 if you are at increased risk. ? You will have tests every 1-10 years, depending on your results and the type of screening test.  Diabetes screening. ? This is done by checking your blood sugar (glucose) after you have not eaten for a while (fasting). ? You may have this done every 1-3 years.  Mammogram. ? This may be done every 1-2 years. ? Talk with your health care provider about when you should start having regular mammograms. This may depend on whether you have a family history of breast cancer.  BRCA-related cancer screening. This may be done if you have a family history of breast, ovarian, tubal, or peritoneal cancers.  Pelvic exam and Pap test. ? This may be done every 3 years starting at age 74. ? Starting at age 33, this may be done every 5 years if you have a Pap test in combination with an HPV test. Other tests  STD (sexually transmitted disease) testing, if you are at risk.  Bone density scan. This is done to screen for osteoporosis. You may have this scan if you are at high risk for osteoporosis. Talk with your health care provider about your test  results, treatment options, and if necessary, the need for more tests. Follow these instructions at home: Eating and drinking  Eat a diet that includes fresh fruits and vegetables, whole grains, lean protein, and low-fat dairy products.  Take vitamin and mineral supplements as recommended by your health care provider.  Do not drink alcohol if: ? Your health care provider tells you not to drink. ? You are pregnant, may be pregnant, or are planning  to become pregnant.  If you drink alcohol: ? Limit how much you have to 0-1 drink a day. ? Be aware of how much alcohol is in your drink. In the [place] one drink equals one 12 oz bottle of beer (355 mL), one 5 oz glass of wine (148 mL), or one 1 oz glass of hard liquor (44 mL).   Lifestyle  Take daily care of your teeth and gums. Brush your teeth every morning and night with fluoride toothpaste. Floss one time each day.  Stay active. Exercise for at least 30 minutes 5 or more days each week.  Do not use any products that contain nicotine or tobacco, such as cigarettes, e-cigarettes, and chewing tobacco. If you need help quitting, ask your health care provider.  Do not use drugs.  If you are sexually active, practice safe sex. Use a condom or other form of protection to prevent STIs (sexually transmitted infections).  If you do not wish to become pregnant, use a form of birth control. If you plan to become pregnant, see your health care provider for a prepregnancy visit.  If told by your health care provider, take low-dose aspirin daily starting at age 57.  Find healthy ways to cope with stress, such as: ? Meditation, yoga, or listening to music. ? Journaling. ? Talking to a trusted person. ? Spending time with friends and family. Safety  Always wear your seat belt while driving or riding in a vehicle.  Do not drive: ? If you have been drinking alcohol. Do not ride with someone who has been drinking. ? When you are tired or distracted. ? While texting.  Wear a helmet and other protective equipment during sports activities.  If you have firearms in your house, make sure you follow all gun safety procedures. What's next?  Visit your health care provider once a year for an annual wellness visit.  Ask your health care provider how often you should have your eyes and teeth checked.  Stay up to date on all vaccines. This information is not intended to replace advice given to you  by your health care provider. Make sure you discuss any questions you have with your health care provider. Document Revised: 11/08/2019 Document Reviewed: 10/15/2017 Elsevier Patient Education  2021 [employer].

## 2020-06-18 NOTE — Assessment & Plan Note (Signed)
Patient encouraged to maintain heart healthy diet, regular exercise, adequate sleep. Consider daily probiotics. Take medications as prescribed. Follows with GYN last pap 2021 next 2024. MGM ordered. Dexa next year. Colonoscopy 2016 repeat 2026. Labs reviewed and requested from endocrinology

## 2020-06-18 NOTE — Assessment & Plan Note (Signed)
Supplement and monitor 

## 2020-06-18 NOTE — Progress Notes (Signed)
Subjective:    Patient ID: Kelli Barry, female    DOB: [birth year], 55 y.o.   MRN: [id number]  No chief complaint on file.   HPI Patient is in today for annual preventative exam follow-up on chronic medical concerns including anxiety, osteoporosis, diabetes and more.  She follows with Dr. Gary Fleet of endocrinology and just had a complete set of lab work including vitamin D vitamin B12, hemoglobin A1c, lipid panel and more last month.  She reports an A1c of 5.8 vitamin D of 27.5 and no other acute concerns.  She acknowledges she was not always taking her daily vitamin D 1000 IU but she was taking her weekly 50,000 IU.  She notes her cholesterol was also somewhat elevated and she is committed to improving her diet and exercising more and is trying to maintain heart healthy diet.  No recent febrile illness or hospitalizations.  The last 2 years have been very stressful.  She just placed her mom in assisted living last Friday and is struggling to now get her mom's home on the market to help pay for her care her mom has progressive dementia and this move has been very stressful.  She continues to follow with GYN for her paps and she is doing MGMs and Dexa scans here. Denies CP/palp/SOB/HA/congestion/fevers/GI or GU c/o. Taking meds as prescribed  Past Medical History:  Diagnosis Date  . Abdominal pain 08/06/2014  . Acute bronchitis 12/15/2010  . Allergy    seasonal  . Anxiety 10/16/2016  . Diabetes mellitus without complication (HCC)    diet controlled  . Diabetes type 2, controlled (HCC) 08/06/2014   Follows with Dr Talmage Nap  . Hyperlipidemia   . Hypothyroidism 08/06/2014  . Osteoporosis   . Osteoporosis 12/05/2009   Qualifier: Diagnosis of  By: Abner Greenspan MD, Misty Stanley    . Preventative health care 08/06/2014  . Rash 10/16/2016    Past Surgical History:  Procedure Laterality Date  . NO PAST SURGERIES      Family History  Problem Relation Age of Onset  . Diabetes Father        DM II  .  Hypertension Father   . Melanoma Father   . Dementia Father   . Memory loss Father   . Parkinsonism Father   . Parkinson's disease Father        lewy body  . Hypertension Mother   . Fibroids Mother        uterine  . Hyperlipidemia Mother   . Stroke Mother   . Atrial fibrillation Mother   . Dementia Mother   . Cancer Maternal Grandmother        colon  . Coronary artery disease Maternal Grandmother   . Heart attack Maternal Grandmother   . Colon cancer Maternal Grandmother 75  . Heart disease Maternal Grandfather        MI at roughly 35  . Stroke Paternal Grandmother   . Dementia Paternal Grandmother   . Cancer Paternal Grandfather        Lung  . Emphysema Paternal Grandfather   . Ovarian cysts Sister   . Heart disease Maternal Aunt 72       MI  . Heart disease Maternal Uncle 79       ish  . Heart disease Maternal Uncle 45       ish, MI  . Allergies Son     Social History   Socioeconomic History  . Marital status: Divorced    Spouse name: Not on  file  . Number of children: Not on file  . Years of education: Not on file  . Highest education level: Not on file  Occupational History  . Not on file  Tobacco Use  . Smoking status: Never Smoker  . Smokeless tobacco: Never Used  Vaping Use  . Vaping Use: Never used  Substance and Sexual Activity  . Alcohol use: Yes    Alcohol/week: 2.0 standard drinks    Types: 2 Glasses of wine per week  . Drug use: No  . Sexual activity: Not Currently    Birth control/protection: Post-menopausal    Comment: 1st intercourse- [age], partner- 1, married- 32 yrs   Other Topics Concern  . Not on file  Social History Narrative  . Not on file   Social Determinants of Health   Financial Resource Strain: Not on file  Food Insecurity: Not on file  Transportation Needs: Not on file  Physical Activity: Not on file  Stress: Not on file  Social Connections: Not on file  Intimate Partner Violence: Not on file    Outpatient  Medications Prior to Visit  Medication Sig Dispense Refill  . estradiol (ESTRACE VAGINAL) 0.1 MG/GM vaginal cream Place 0.25 Applicatorfuls vaginally 2 (two) times a week. Apply a thin layer on vulva 2 times a week as needed. 42.5 g 4  . estradiol (VIVELLE-DOT) 0.05 MG/24HR patch Place 1 patch (0.05 mg total) onto the skin 2 (two) times a week. 24 patch 4  . levothyroxine (SYNTHROID, LEVOTHROID) 75 MCG tablet Take 75 mcg by mouth daily before breakfast.    . ONE TOUCH ULTRA TEST test strip 1 strip.  4  . progesterone (PROMETRIUM) 100 MG capsule Take 1 capsule (100 mg total) by mouth at bedtime. 90 capsule 4  . progesterone (PROMETRIUM) 100 MG capsule Take 1 capsule (100 mg total) by mouth at bedtime. 90 capsule 4  . Vitamin D, Ergocalciferol, (DRISDOL) 50000 units CAPS capsule Take 50,000 Units by mouth every 7 (seven) days.    . ALPRAZolam (XANAX) 0.25 MG tablet Take 1 tablet (0.25 mg total) by mouth 2 (two) times daily as needed for anxiety. 30 tablet 3  . risedronate (ACTONEL) 35 MG tablet Take 1 tablet (35 mg total) by mouth once a week. osteoporosis 4 tablet 11   No facility-administered medications prior to visit.    Allergies  Allergen Reactions  . Codeine   . Penicillins     Review of Systems  Constitutional: Negative for chills, fever and malaise/fatigue.  HENT: Negative for congestion and hearing loss.   Eyes: Negative for discharge.  Respiratory: Negative for cough, sputum production and shortness of breath.   Cardiovascular: Negative for chest pain, palpitations and leg swelling.  Gastrointestinal: Negative for abdominal pain, blood in stool, constipation, diarrhea, heartburn, nausea and vomiting.  Genitourinary: Negative for dysuria, frequency, hematuria and urgency.  Musculoskeletal: Positive for myalgias and neck pain. Negative for back pain and falls.  Skin: Negative for rash.  Neurological: Negative for dizziness, sensory change, loss of consciousness, weakness and  headaches.  Endo/Heme/Allergies: Negative for environmental allergies. Does not bruise/bleed easily.  Psychiatric/Behavioral: Negative for depression and suicidal ideas. The patient is nervous/anxious. The patient does not have insomnia.        Objective:    Physical Exam Constitutional:      General: She is not in acute distress.    Appearance: She is not diaphoretic.  HENT:     Head: Normocephalic and atraumatic.     Right Ear:  External ear normal.     Left Ear: External ear normal.     Nose: Nose normal.     Mouth/Throat:     Pharynx: No oropharyngeal exudate.  Eyes:     General: No scleral icterus.       Right eye: No discharge.        Left eye: No discharge.     Conjunctiva/sclera: Conjunctivae normal.     Pupils: Pupils are equal, round, and reactive to light.  Neck:     Thyroid: No thyromegaly.  Cardiovascular:     Rate and Rhythm: Normal rate and regular rhythm.     Heart sounds: Normal heart sounds. No murmur heard.   Pulmonary:     Effort: Pulmonary effort is normal. No respiratory distress.     Breath sounds: Normal breath sounds. No wheezing or rales.  Abdominal:     General: Bowel sounds are normal. There is no distension.     Palpations: Abdomen is soft. There is no mass.     Tenderness: There is no abdominal tenderness.  Musculoskeletal:        General: No tenderness. Normal range of motion.     Cervical back: Normal range of motion and neck supple.     Comments: Spasm L SCM  Lymphadenopathy:     Cervical: No cervical adenopathy.  Skin:    General: Skin is warm and dry.     Findings: No rash.  Neurological:     Mental Status: She is alert and oriented to person, place, and time.     Cranial Nerves: No cranial nerve deficit.     Coordination: Coordination normal.     Deep Tendon Reflexes: Reflexes are normal and symmetric. Reflexes normal.     BP 102/66   Pulse (!) 51   Temp 98.3 F (36.8 C)   Resp 16   Ht  (1.549 m)   Wt 105 lb 3.2 oz  (47.7 kg)   LMP 01/14/2011   SpO2 99%   BMI 19.88 kg/m  Wt Readings from Last 3 Encounters:  06/18/20 105 lb 3.2 oz (47.7 kg)  04/13/20 105 lb (47.6 kg)  06/06/19 106 lb (48.1 kg)    Diabetic Foot Exam - Simple   No data filed    Lab Results  Component Value Date   WBC 10.3 01/16/2017   HGB 13.5 01/16/2017   HCT 40.8 01/16/2017   PLT 277.0 01/16/2017   GLUCOSE 90 01/16/2017   CHOL 195 01/16/2017   TRIG 66.0 01/16/2017   HDL 65.30 01/16/2017   LDLCALC 117 (H) 01/16/2017   ALT 9 01/16/2017   AST 14 01/16/2017   NA 138 01/16/2017   K 4.4 01/16/2017   CL 102 01/16/2017   CREATININE 0.79 01/16/2017   BUN 19 01/16/2017   CO2 29 01/16/2017   HGBA1C 5.7 01/16/2017    No results found for: TSH Lab Results  Component Value Date   WBC 10.3 01/16/2017   HGB 13.5 01/16/2017   HCT 40.8 01/16/2017   MCV 89.6 01/16/2017   PLT 277.0 01/16/2017   Lab Results  Component Value Date   NA 138 01/16/2017   K 4.4 01/16/2017   CO2 29 01/16/2017   GLUCOSE 90 01/16/2017   BUN 19 01/16/2017   CREATININE 0.79 01/16/2017   BILITOT 0.6 01/16/2017   ALKPHOS 60 01/16/2017   AST 14 01/16/2017   ALT 9 01/16/2017   PROT 7.2 01/16/2017   ALBUMIN 4.4 01/16/2017   CALCIUM 9.6  01/16/2017   GFR 80.65 01/16/2017   Lab Results  Component Value Date   CHOL 195 01/16/2017   Lab Results  Component Value Date   HDL 65.30 01/16/2017   Lab Results  Component Value Date   LDLCALC 117 (H) 01/16/2017   Lab Results  Component Value Date   TRIG 66.0 01/16/2017   Lab Results  Component Value Date   CHOLHDL 3 01/16/2017   Lab Results  Component Value Date   HGBA1C 5.7 01/16/2017       Assessment & Plan:   Problem List Items Addressed This Visit    Vitamin D deficiency    Supplement and monitor      MIXED HYPERLIPIDEMIA    Encouraged heart healthy diet, increase exercise, avoid trans fats, consider a krill oil cap daily. She is following with endocrinology, records requested       Osteoporosis    Encouraged to get adequate exercise, calcium and vitamin d intake. Labs requested she reports Vit D was 27.5 last month. Refill given on Actonel      Relevant Medications   risedronate (ACTONEL) 35 MG tablet   Idiopathic scoliosis and kyphoscoliosis    Noted to have spasm over SCM on left encouraged, Encouraged moist heat and gentle stretching as tolerated. May try NSAIDs and topical creams as directed and report if symptoms worsen or seek immediate care. Consider Taichi vs yoga vs PT and massage and chiropractic. Avoid surgery if at all possible      Asymptomatic postmenopausal status    Follows with GYN Dr Seymour Bars last pap 2021 will repeat in 2024.      Diabetes type 2, controlled (HCC)    hgba1c acceptable, minimize simple carbs. Increase exercise as tolerated. Continue current  She follows with endocrinology and they check meds and labs. She reports hgba1c in 5.8 in April. Requested records from Dr Talmage Nap her endocrinologist      Preventative health care    Patient encouraged to maintain heart healthy diet, regular exercise, adequate sleep. Consider daily probiotics. Take medications as prescribed. Follows with GYN last pap 2021 next 2024. MGM ordered. Dexa next year. Colonoscopy 2016 repeat 2026. Labs reviewed and requested from endocrinology      Anxiety    has been under a great deal of stress the past 2 years and just placed her elderly mother with dementia  in assisted living last week. She is trying to sell her house to pay her expenses now and it is very stressful. Allowed a refill on Xanax to use prn      Relevant Medications   ALPRAZolam (XANAX) 0.25 MG tablet   Vitamin B12 deficiency    Supplement and monitor. She follows with endocrinology and they check       Other Visit Diagnoses    Breast cancer screening by mammogram    -  Primary   Relevant Orders   MM 3D SCREEN BREAST BILATERAL      I am having Azhar Nairn maintain her ONE TOUCH  ULTRA TEST, levothyroxine, Vitamin D (Ergocalciferol), progesterone, estradiol, estradiol, progesterone, ALPRAZolam, and risedronate.  Meds ordered this encounter  Medications  . ALPRAZolam (XANAX) 0.25 MG tablet    Sig: Take 1 tablet (0.25 mg total) by mouth 2 (two) times daily as needed for anxiety.    Dispense:  30 tablet    Refill:  3  . risedronate (ACTONEL) 35 MG tablet    Sig: Take 1 tablet (35 mg total) by mouth once a week.  osteoporosis    Dispense:  4 tablet    Refill:  11     Danise Edge, MD

## 2020-06-18 NOTE — Assessment & Plan Note (Signed)
Encouraged heart healthy diet, increase exercise, avoid trans fats, consider a krill oil cap daily. She is following with endocrinology, records requested

## 2020-06-18 NOTE — Assessment & Plan Note (Signed)
hgba1c acceptable, minimize simple carbs. Increase exercise as tolerated. Continue current  She follows with endocrinology and they check meds and labs. She reports hgba1c in 5.8 in April. Requested records from Dr Talmage Nap her endocrinologist

## 2020-08-27 ENCOUNTER — Ambulatory Visit (HOSPITAL_BASED_OUTPATIENT_CLINIC_OR_DEPARTMENT_OTHER)
Admission: RE | Admit: 2020-08-27 | Discharge: 2020-08-27 | Disposition: A | Discharge status: Home / Self Care | Payer: 59 | Source: Ambulatory Visit | Attending: Family Medicine | Admitting: Family Medicine

## 2020-08-27 ENCOUNTER — Encounter (HOSPITAL_BASED_OUTPATIENT_CLINIC_OR_DEPARTMENT_OTHER): Payer: Self-pay

## 2020-08-27 ENCOUNTER — Other Ambulatory Visit: Payer: Self-pay

## 2020-08-27 DIAGNOSIS — Z1231 Encounter for screening mammogram for malignant neoplasm of breast: Secondary | ICD-10-CM | POA: Diagnosis present

## 2020-09-05 ENCOUNTER — Ambulatory Visit
Admission: RE | Admit: 2020-09-05 | Discharge: 2020-09-05 | Disposition: A | Discharge status: Home / Self Care | Payer: 59 | Source: Ambulatory Visit | Attending: Emergency Medicine | Admitting: Emergency Medicine

## 2020-09-05 ENCOUNTER — Other Ambulatory Visit: Payer: Self-pay

## 2020-09-05 VITALS — BP 112/78 | HR 64 | Temp 98.2°F | Resp 18

## 2020-09-05 DIAGNOSIS — N390 Urinary tract infection, site not specified: Secondary | ICD-10-CM

## 2020-09-05 LAB — POCT URINALYSIS DIP (MANUAL ENTRY)
Bilirubin, UA: NEGATIVE
Glucose, UA: NEGATIVE mg/dL
Nitrite, UA: NEGATIVE
Protein Ur, POC: NEGATIVE mg/dL
Spec Grav, UA: 1.01 (ref 1.010–1.025)
Urobilinogen, UA: 0.2 E.U./dL
pH, UA: 6 (ref 5.0–8.0)

## 2020-09-05 MED ORDER — NITROFURANTOIN MONOHYD MACRO 100 MG PO CAPS: 100.0000 mg | ORAL_CAPSULE | Freq: Two times a day (BID) | ORAL | 0 refills | Status: AC

## 2020-09-05 NOTE — Discharge Instructions (Addendum)
Urine showed evidence of infection. We are treating you with macrobid- twice daily for 5 days. Be sure to take full course. Stay hydrated- urine should be pale yellow to clear.  °Please return or follow up with your primary provider if symptoms not improving with treatment. Please return sooner if you have worsening of symptoms or develop fever, nausea, vomiting, abdominal pain, back pain, lightheadedness, dizziness. °

## 2020-09-05 NOTE — ED Provider Notes (Signed)
[facility]    CSN: [id number] Arrival date & time: 09/05/20  1247      History   Chief Complaint Chief Complaint  Patient presents with   Dysuria    HPI Kelli Barry is a 55 y.o. female history of DM type II, hyperlipidemia, presenting today for evaluation of dysuria.  Reports that over the past 2 days she has had dysuria, urinary frequency and hematuria.  Denies fevers nausea vomiting or abdominal pain.  HPI  Past Medical History:  Diagnosis Date   Abdominal pain 08/06/2014   Acute bronchitis 12/15/2010   Allergy    seasonal   Anxiety 10/16/2016   Diabetes mellitus without complication (HCC)    diet controlled   Diabetes type 2, controlled (HCC) 08/06/2014   Follows with Dr Talmage Nap   Hyperlipidemia    Hypothyroidism 08/06/2014   Osteoporosis    Osteoporosis 12/05/2009   Qualifier: Diagnosis of  By: Abner Greenspan MD, Stacey     Preventative health care 08/06/2014   Rash 10/16/2016    Patient Active Problem List   Diagnosis Date Noted   Vitamin B12 deficiency 12/12/2018   Anxiety 10/16/2016   Rash 10/16/2016   Hypothyroidism 08/06/2014   Diabetes type 2, controlled (HCC) 08/06/2014   Preventative health care 08/06/2014   Generalized abdominal pain 08/06/2014   Vitamin D deficiency 12/05/2009   MIXED HYPERLIPIDEMIA 12/05/2009   Osteoporosis 12/05/2009   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 Authorizing Provider  nitrofurantoin, macrocrystal-monohydrate, (MACROBID) 100 MG capsule Take 1 capsule (100 mg total) by mouth 2  (two) times daily for 5 days. 09/05/20 09/10/20 Yes Jaree Trinka C, PA-C  ALPRAZolam (XANAX) 0.25 MG tablet Take 1 tablet (0.25 mg total) by mouth 2 (two) times daily as needed for anxiety. 06/18/20   Bradd Canary, MD  estradiol (ESTRACE VAGINAL) 0.1 MG/GM vaginal cream Place 0.25 Applicatorfuls vaginally 2 (two) times a week. Apply a thin layer on vulva 2 times a week as needed. 04/16/20   Genia Del, MD  estradiol (VIVELLE-DOT) 0.05 MG/24HR patch Place 1 patch (0.05 mg total) onto the skin 2 (two) times a week. 04/16/20   Genia Del, MD  levothyroxine (SYNTHROID, LEVOTHROID) 75 MCG tablet Take 75 mcg by mouth daily before breakfast.    [provider]  ONE TOUCH ULTRA TEST test strip 1 strip. 05/05/14   [provider]  progesterone (PROMETRIUM) 100 MG capsule Take 1 capsule (100 mg total) by mouth at bedtime. 04/13/19   Genia Del, MD  progesterone (PROMETRIUM) 100 MG capsule Take 1 capsule (100 mg total) by mouth at bedtime. 04/13/20   Genia Del, MD  risedronate (ACTONEL) 35 MG tablet Take 1 tablet (35 mg total) by mouth once a week. osteoporosis 06/18/20  Bradd Canary, MD  Vitamin D, Ergocalciferol, (DRISDOL) 50000 units CAPS capsule Take 50,000 Units by mouth every 7 (seven) days.    [provider]    Family History Family History  Problem Relation Age of Onset   Diabetes Father        DM II   Hypertension Father    Melanoma Father    Dementia Father    Memory loss Father    Parkinsonism Father    Parkinson's disease Father        lewy body   Hypertension Mother    Fibroids Mother        uterine   Hyperlipidemia Mother    Stroke Mother    Atrial fibrillation Mother    Dementia Mother    Cancer Maternal Grandmother        colon   Coronary artery disease Maternal Grandmother    Heart attack Maternal Grandmother    Colon cancer Maternal Grandmother 23   Heart disease Maternal Grandfather        MI at roughly 37    Stroke Paternal Grandmother    Dementia Paternal Grandmother    Cancer Paternal Grandfather        Lung   Emphysema Paternal Grandfather    Ovarian cysts Sister    Heart disease Maternal Aunt 84       MI   Heart disease Maternal Uncle 38       ish   Heart disease Maternal Uncle 24       ish, MI   Allergies Son     Social History Social History   Tobacco Use   Smoking status: Never   Smokeless tobacco: Never  Vaping Use   Vaping Use: Never used  Substance Use Topics   Alcohol use: Yes    Alcohol/week: 2.0 standard drinks    Types: 2 Glasses of wine per week   Drug use: No     Allergies   Codeine and Penicillins   Review of Systems Review of Systems  Constitutional:  Negative for fever.  Respiratory:  Negative for shortness of breath.   Cardiovascular:  Negative for chest pain.  Gastrointestinal:  Negative for abdominal pain, diarrhea, nausea and vomiting.  Genitourinary:  Positive for dysuria, frequency and urgency. Negative for flank pain, genital sores, hematuria, menstrual problem, vaginal bleeding, vaginal discharge and vaginal pain.  Musculoskeletal:  Negative for back pain.  Skin:  Negative for rash.  Neurological:  Negative for dizziness, light-headedness and headaches.    Physical Exam Triage Vital Signs ED Triage Vitals  Enc Vitals Group     BP 09/05/20 1318 112/78     Pulse Rate 09/05/20 1318 64     Resp 09/05/20 1318 18     Temp 09/05/20 1318 98.2 F (36.8 C)     Temp src --      SpO2 09/05/20 1318 97 %     Weight --      Height --      Head Circumference --      Peak Flow --      Pain Score 09/05/20 1317 7     Pain Loc --      Pain Edu? --      Excl. in GC? --    No data found.  Updated Vital Signs BP 112/78   Pulse 64   Temp 98.2 F (36.8 C)   Resp 18   LMP 01/14/2011   SpO2 97%   Visual Acuity Right  Eye Distance:   Left Eye Distance:   Bilateral Distance:    Right Eye Near:   Left Eye Near:    Bilateral Near:      Physical Exam Vitals and nursing note reviewed.  Constitutional:      Appearance: She is well-developed.     Comments: No acute distress  HENT:     Head: Normocephalic and atraumatic.     Nose: Nose normal.  Eyes:     Conjunctiva/sclera: Conjunctivae normal.  Cardiovascular:     Rate and Rhythm: Normal rate.  Pulmonary:     Effort: Pulmonary effort is normal. No respiratory distress.  Abdominal:     General: There is no distension.  Musculoskeletal:        General: Normal range of motion.     Cervical back: Neck supple.  Skin:    General: Skin is warm and dry.  Neurological:     Mental Status: She is alert and oriented to person, place, and time.     UC Treatments / Results  Labs (all labs ordered are listed, but only abnormal results are displayed) Labs Reviewed  POCT URINALYSIS DIP (MANUAL ENTRY) - Abnormal; Notable for the following components:      Result Value   Color, UA yellow (*)    Ketones, POC UA small (15) (*)    Blood, UA moderate (*)    Leukocytes, UA Small (1+) (*)    All other components within normal limits  URINE CULTURE    EKG   Radiology No results found.  Procedures Procedures (including critical care time)  Medications Ordered in UC Medications - No data to display  Initial Impression / Assessment and Plan / UC Course  I have reviewed the triage vital signs and the nursing notes.  Pertinent labs & imaging results that were available during my care of the patient were reviewed by me and considered in my medical decision making (see chart for details).     UA consistent with UTI, small leuks, moderate hemoglobin, will send for culture to confirm as well as check sensitivities.  Empirically treating with Macrobid today twice daily x5 days, push fluids.  Alter therapy as needed based off results.  Discussed strict return precautions. Patient verbalized understanding and is agreeable with plan.  Final Clinical Impressions(s) / UC  Diagnoses   Final diagnoses:  Lower urinary tract infection, acute     Discharge Instructions      Urine showed evidence of infection. We are treating you with macrobid- twice daily for 5 days. Be sure to take full course. Stay hydrated- urine should be pale yellow to clear.  Please return or follow up with your primary provider if symptoms not improving with treatment. Please return sooner if you have worsening of symptoms or develop fever, nausea, vomiting, abdominal pain, back pain, lightheadedness, dizziness.     ED Prescriptions     Medication Sig Dispense Auth. Provider   nitrofurantoin, macrocrystal-monohydrate, (MACROBID) 100 MG capsule Take 1 capsule (100 mg total) by mouth 2 (two) times daily for 5 days. 10 capsule Davien Malone, [place] C, PA-C      PDMP not reviewed this encounter.   Lew Dawes, [place] 09/05/20 1342

## 2020-09-05 NOTE — ED Triage Notes (Signed)
Pt presents with complaints of dysuria, urinary frequency, and hematuria x 2 days. Concerned for uti.

## 2020-09-07 LAB — URINE CULTURE: Culture: 60000 — AB

## 2021-04-18 ENCOUNTER — Ambulatory Visit: Payer: 59 | Admitting: Obstetrics & Gynecology

## 2021-04-19 ENCOUNTER — Ambulatory Visit: Payer: 59 | Admitting: Obstetrics & Gynecology

## 2021-05-16 ENCOUNTER — Other Ambulatory Visit: Payer: Self-pay | Admitting: Obstetrics & Gynecology

## 2021-05-17 NOTE — Telephone Encounter (Signed)
Annual exam scheduled on 06/03/21 °

## 2021-05-31 ENCOUNTER — Encounter: Payer: Self-pay | Admitting: Obstetrics & Gynecology

## 2021-05-31 ENCOUNTER — Ambulatory Visit (INDEPENDENT_AMBULATORY_CARE_PROVIDER_SITE_OTHER): Payer: 59 | Admitting: Obstetrics & Gynecology

## 2021-05-31 VITALS — BP 110/64 | HR 70 | Resp 16 | Ht 60.25 in | Wt 109.0 lb

## 2021-05-31 DIAGNOSIS — Z79890 Hormone replacement therapy (postmenopausal): Secondary | ICD-10-CM | POA: Diagnosis not present

## 2021-05-31 DIAGNOSIS — M810 Age-related osteoporosis without current pathological fracture: Secondary | ICD-10-CM

## 2021-05-31 DIAGNOSIS — Z01419 Encounter for gynecological examination (general) (routine) without abnormal findings: Secondary | ICD-10-CM | POA: Diagnosis not present

## 2021-05-31 MED ORDER — ESTRADIOL 0.1 MG/GM VA CREA: 0.2500 | TOPICAL_CREAM | VAGINAL | 4 refills | Status: DC

## 2021-05-31 MED ORDER — ESTRADIOL 0.05 MG/24HR TD PTTW: 1.0000 | MEDICATED_PATCH | TRANSDERMAL | 4 refills | Status: DC

## 2021-05-31 MED ORDER — PROGESTERONE MICRONIZED 100 MG PO CAPS: 100.0000 mg | ORAL_CAPSULE | Freq: Every day | ORAL | 4 refills | Status: DC

## 2021-05-31 MED ORDER — RISEDRONATE SODIUM 35 MG PO TABS: 35.0000 mg | ORAL_TABLET | ORAL | 4 refills | Status: DC

## 2021-05-31 NOTE — Progress Notes (Signed)
? ? ?Kelli Barry [birth year] [id number] ? ? ?History:    56 y.o. G2P2L2 Divorced.  Works in [employer].  ?  ?RP:  Established patient presenting for annual gyn exam  ?  ?HPI: Postmenopause, well on hormone replacement therapy x about 10 years.  On Vivelle-Dot 0.05 and Prometrium 100 mg at bedtime.  No PMB.  No pelvic pain.  Not currently sexually active.  Pap 03/2019 Neg.  No h/o abnormal Pap.  Will repeat Pap at 1 yrs. Prefers to continue with Estrace cream for vaginal dryness.  Mild SUI, doing Kegels.  Breasts normal. Mammo 08/2020 Neg. Body mass index 21.11.  Good fitness and healthy nutrition.  On Actonel for osteoporosis, followed by [facility]. Last BD 05/2019, will repeat now. Taking supplements of vitamin D and calcium.  Health labs with family physician.  [place] 2016. ? ? ?Past medical history,surgical history, family history and social history were all reviewed and documented in the EPIC chart. ? ?Gynecologic History ?Patient's last menstrual period was 01/14/2011. ? ?Obstetric History ?OB History  ?Gravida Para Term Preterm AB Living  ?2 2       2   ?SAB IAB Ectopic Multiple Live Births  ?           ?  ?# Outcome Date GA Lbr Len/2nd Weight Sex Delivery Anes PTL Lv  ?2 Para           ?1 Para           ? ? ? ?ROS: A ROS was performed and pertinent positives and negatives are included in the history. ? GENERAL: No fevers or chills. HEENT: No change in vision, no earache, sore throat or sinus congestion. NECK: No pain or stiffness. CARDIOVASCULAR: No chest pain or pressure. No palpitations. PULMONARY: No shortness of breath, cough or wheeze. GASTROINTESTINAL: No abdominal pain, nausea, vomiting or diarrhea, melena or bright red blood per rectum. GENITOURINARY: No urinary frequency, urgency, hesitancy or dysuria. MUSCULOSKELETAL: No joint or muscle pain, no back pain, no recent trauma. DERMATOLOGIC: No rash, no itching, no lesions. ENDOCRINE: No polyuria, polydipsia, no heat or cold intolerance. No recent change  in weight. HEMATOLOGICAL: No anemia or easy bruising or bleeding. NEUROLOGIC: No headache, seizures, numbness, tingling or weakness. PSYCHIATRIC: No depression, no loss of interest in normal activity or change in sleep pattern.  ?  ? ?Exam: ? ? ?BP 110/64   Pulse 70   Resp 16   Ht 5' 0.25" (1.53 m)   Wt 109 lb (49.4 kg)   LMP 01/14/2011   BMI 21.11 kg/m?  ? ?Body mass index is 21.11 kg/m?. ? ?General appearance : Well developed well nourished female. No acute distress ?HEENT: Eyes: no retinal hemorrhage or exudates,  Neck supple, trachea midline, no carotid bruits, no thyroidmegaly ?Lungs: Clear to auscultation, no rhonchi or wheezes, or rib retractions  ?Heart: Regular rate and rhythm, no murmurs or gallops ?Breast:Examined in sitting and supine position were symmetrical in appearance, no palpable masses or tenderness,  no skin retraction, no nipple inversion, no nipple discharge, no skin discoloration, no axillary or supraclavicular lymphadenopathy ?Abdomen: no palpable masses or tenderness, no rebound or guarding ?Extremities: no edema or skin discoloration or tenderness ? ?Pelvic: Vulva: Normal ?            Vagina: No gross lesions or discharge ? Cervix: No gross lesions or discharge ? Uterus  AV, normal size, shape and consistency, non-tender and mobile ? Adnexa  Without masses or tenderness ? Anus: Normal ? ? ?  Assessment/Plan:  56 y.o. female for annual exam  ? ?1. Well female exam with routine gynecological exam ?Postmenopause, well on hormone replacement therapy x about 10 years.  On Vivelle-Dot 0.05 and Prometrium 100 mg at bedtime.  No PMB.  No pelvic pain.  Not currently sexually active.  Pap 03/2019 Neg.  No h/o abnormal Pap.  Will repeat Pap at 1 yrs. Prefers to continue with Estrace cream for vaginal dryness.  Mild SUI, doing Kegels.  Breasts normal. Mammo 08/2020 Neg. Body mass index 21.11.  Good fitness and healthy nutrition.  On Actonel for osteoporosis, followed by Endocr[facility]. Last BD 05/2019,  will repeat now. Taking supplements of vitamin D and calcium.  Health labs with family physician.  [place] 2016. ? ?2. Post-menopause on HRT (hormone replacement therapy) ?Postmenopause, well on hormone replacement therapy x about 10 years.  On Vivelle-Dot 0.05 and Prometrium 100 mg at bedtime.  No PMB.  No pelvic pain.  Not currently sexually active. Prefers to continue with Estrace cream for vaginal dryness. ? ?3. Age-related osteoporosis without current pathological fracture ?Body mass index 21.11.  Good fitness and healthy nutrition.  On Actonel for osteoporosis, followed by Endocr[facility]. Last BD 05/2019, will repeat now. Taking supplements of vitamin D and calcium.  ?- DG Bone Density; Future ? ?Other orders ?- rosuvastatin (CRESTOR) 5 MG tablet; Take 5 mg by mouth 3 (three) times a week. ?- risedronate (ACTONEL) 35 MG tablet; Take 1 tablet (35 mg total) by mouth once a week. osteoporosis ?- estradiol (VIVELLE-DOT) 0.05 MG/24HR patch; Place 1 patch (0.05 mg total) onto the skin 2 (two) times a week. ?- estradiol (ESTRACE VAGINAL) 0.1 MG/GM vaginal cream; Place 0.25 Applicatorfuls vaginally 2 (two) times a week. Apply a thin layer on vulva 2 times a week as needed. ?- progesterone (PROMETRIUM) 100 MG capsule; Take 1 capsule (100 mg total) by mouth at bedtime.  ? ?Genia Del MD, 4:28 PM 05/31/2021 ? ?  ?

## 2021-06-03 ENCOUNTER — Ambulatory Visit: Payer: 59 | Admitting: Obstetrics & Gynecology

## 2021-06-04 ENCOUNTER — Encounter: Payer: Self-pay | Admitting: Family Medicine

## 2021-06-04 ENCOUNTER — Encounter: Payer: Self-pay | Admitting: Obstetrics & Gynecology

## 2021-06-04 ENCOUNTER — Ambulatory Visit (INDEPENDENT_AMBULATORY_CARE_PROVIDER_SITE_OTHER): Payer: 59 | Admitting: Family Medicine

## 2021-06-04 VITALS — BP 111/56 | HR 82 | Ht 60.25 in | Wt 108.4 lb

## 2021-06-04 DIAGNOSIS — J0140 Acute pansinusitis, unspecified: Secondary | ICD-10-CM

## 2021-06-04 MED ORDER — DOXYCYCLINE HYCLATE 100 MG PO TABS: 100.0000 mg | ORAL_TABLET | Freq: Two times a day (BID) | ORAL | 0 refills | Status: AC

## 2021-06-04 MED ORDER — PREDNISONE 20 MG PO TABS: 40.0000 mg | ORAL_TABLET | Freq: Every day | ORAL | 0 refills | Status: AC

## 2021-06-04 NOTE — Progress Notes (Signed)
? ?  Acute Office Visit ? ?Subjective:  ? ?  ?Patient ID: Kelli Barry, female    DOB: 12/08/1965, 56 y.o.   MRN: [id number] ? ?No chief complaint on file. ? ? ?Sinusitis ?The current episode started 1 to 4 weeks ago. The problem has been gradually worsening since onset. There has been no fever. The pain is severe. Associated symptoms include congestion, coughing, ear pain, headaches, sinus pressure, sneezing and a sore throat. Pertinent negatives include no chills, diaphoresis, hoarse voice, neck pain, shortness of breath or swollen glands. Treatments tried: OTC cough/cold/allergy medicines, 3 days of prednisone 20 mg. The treatment provided mild relief.  ?Had vertigo symptoms for a few days at the begging, but that has eased up after taking 3 days of prednisone 20 mg (from urgent care), but still will get mildly dizzy with bending over. ? ? ? ?Review of Systems ?All review of systems negative except what is listed in the HPI ? ? ?   ?Objective:  ?  ?BP (!) 111/56   Pulse 82   Ht 5' 0.25" (1.53 m)   Wt 108 lb 6.4 oz (49.2 kg)   LMP 01/14/2011   SpO2 100%   BMI 21.00 kg/m?  ? ? ?Physical Exam ?Vitals reviewed.  ?Constitutional:   ?   Appearance: Normal appearance. She is normal weight.  ?HENT:  ?   Head: Normocephalic and atraumatic.  ?   Right Ear: Tympanic membrane normal.  ?   Left Ear: Tympanic membrane normal.  ?   Nose: Congestion present.  ?   Mouth/Throat:  ?   Mouth: Mucous membranes are moist.  ?   Pharynx: Oropharynx is clear.  ?   Comments: Cobblestoning, postnasal drainage ?Eyes:  ?   Conjunctiva/sclera: Conjunctivae normal.  ?Cardiovascular:  ?   Rate and Rhythm: Normal rate and regular rhythm.  ?Pulmonary:  ?   Effort: Pulmonary effort is normal.  ?   Breath sounds: Normal breath sounds. No wheezing, rhonchi or rales.  ?Musculoskeletal:  ?   Cervical back: Normal range of motion and neck supple.  ?Skin: ?   General: Skin is warm and dry.  ?Neurological:  ?   General: No focal deficit present.  ?    Mental Status: She is alert and oriented to person, place, and time. Mental status is at baseline.  ?Psychiatric:     ?   Mood and Affect: Mood normal.     ?   Behavior: Behavior normal.     ?   Thought Content: Thought content normal.     ?   Judgment: Judgment normal.  ? ? ?No results found for any visits on 06/04/21. ? ? ?   ?Assessment & Plan:  ? ?1. Acute non-recurrent pansinusitis ?Starting doxycycline and prednisone - take with food. Do not take NSAIDs (ibuprofen, Aleve, etc.) while on the prednisone. Continue supportive measures including rest, hydration, humidifier use, steam showers, warm compresses to sinuses, warm liquids with lemon and honey, and over-the-counter cough, cold, and analgesics as needed.   ? ?- doxycycline (VIBRA-TABS) 100 MG tablet; Take 1 tablet (100 mg total) by mouth 2 (two) times daily for 10 days.  Dispense: 20 tablet; Refill: 0 ?- predniSONE (DELTASONE) 20 MG tablet; Take 2 tablets (40 mg total) by mouth daily with breakfast for 5 days.  Dispense: 10 tablet; Refill: 0 ? ? ?Return if symptoms worsen or fail to improve. ? ?Clayborne Dana, NP ? ? ?

## 2021-06-04 NOTE — Patient Instructions (Signed)
Starting doxycycline and prednisone - take with food. Do not take NSAIDs (ibuprofen, Aleve, etc.) while on the prednisone. Continue supportive measures including rest, hydration, humidifier use, steam showers, warm compresses to sinuses, warm liquids with lemon and honey, and over-the-counter cough, cold, and analgesics as needed.   ? ?Please contact office for follow-up if symptoms do not improve or worsen. Seek emergency care if symptoms become severe. ? ?

## 2021-07-08 ENCOUNTER — Encounter (HOSPITAL_BASED_OUTPATIENT_CLINIC_OR_DEPARTMENT_OTHER): Payer: Self-pay | Admitting: Cardiovascular Disease

## 2021-07-08 ENCOUNTER — Ambulatory Visit (INDEPENDENT_AMBULATORY_CARE_PROVIDER_SITE_OTHER): Payer: 59 | Admitting: Cardiovascular Disease

## 2021-07-08 DIAGNOSIS — E782 Mixed hyperlipidemia: Secondary | ICD-10-CM | POA: Diagnosis not present

## 2021-07-08 NOTE — Patient Instructions (Addendum)
Medication Instructions:  Your physician recommends that you continue on your current medications as directed. Please refer to the Current Medication list given to you today.   *If you need a refill on your cardiac medications before your next appointment, please call your pharmacy*  Lab Work: NONE  Testing/Procedures: NONE  Follow-Up: At [facility], you and your health needs are our priority.  As part of our continuing mission to provide you with exceptional heart care, we have created designated Provider Care Teams.  These Care Teams include your primary Cardiologist (physician) and Advanced Practice Providers (APPs -  Physician Assistants and Nurse Practitioners) who all work together to provide you with the care you need, when you need it.  We recommend signing up for the patient portal called "MyChart".  Sign up information is provided on this After Visit Summary.  MyChart is used to connect with patients for Virtual Visits (Telemedicine).  Patients are able to view lab/test results, encounter notes, upcoming appointments, etc.  Non-urgent messages can be sent to your provider as well.   To learn more about what you can do with MyChart, go to [email or web address].    Your next appointment:   12 month(s)  The format for your next appointment:   In Person  Provider:   Ronn Melena NP   FOLLOW UP WITH DR [facility] IN 2 YEARS

## 2021-07-08 NOTE — Progress Notes (Signed)
Cardiology Office Note   Date:  07/08/2021   ID:  Kelli Barry, DOB [birth year], MRN [id number]  PCP:  Mosie Lukes, MD  Cardiologist:   Skeet Latch, MD   No chief complaint on file.    History of Present Illness: Kelli Barry is a 56 y.o. female with diabetes, hyperlipidemia, and scoliosis for follow-up.  She was initially seen in 2018 for the evaluation of family history of CAD and prior to that in 2016 for atypical chest pain.  Her mother had a stroke and was diagnosed with atrial fibrillation.  Therefore she followed up in 2018 for a checkup.  Overall she was feeling well.  She was referred for a cardiac calcium score that revealed no plaque in a score of 0.    At her last appointment she complained of throbbing in her right leg attributable to varicose veins. Compression socks seemed to help but she planned to follow-up with vein and vascular. She struggled with her diet and with stress; she noted occasional palpitations only when feeling stressed. She was working from home due to the pandemic but continued to walk daily with no anginal symptoms. On 09/05/2020 she presented to urgent care with concerns for dysuria. UA was consistent with UTI. She was empirically treated with Macrobid twice daily x5 days.  Today, she reports being under a lot of stress this year. Her mother has dementia and is now moved into a nursing home. About 2 months ago she started exercising 20 minutes a day. She continues to work on increasing her exercise. However, she has been limited by recent health issues. She developed symptoms of vertigo that she attributes to seasonal allergies. Her plan is resume receiving injections for management of her allergy symptoms. Then while trying a high intensity indoor workout she injured her hip but is recovering well. At this time she has been taking rosuvastatin 5 mg three times a week. She is monitoring for any side effects. Regarding her diet she was not doing  well for a time due to ordering out frequently with a busy schedule. She reports she is trying to follow a healthier diet. She denies any palpitations, chest pain, shortness of breath, or peripheral edema. No lightheadedness, headaches, syncope, orthopnea, or PND.   Past Medical History:  Diagnosis Date   Abdominal pain 08/06/2014   Acute bronchitis 12/15/2010   Allergy    seasonal   Anxiety 10/16/2016   Diabetes mellitus without complication ([place])    diet controlled   Diabetes type 2, controlled ([place]) 08/06/2014   Follows with Dr Chalmers Cater   Hyperlipidemia    Hypothyroidism 08/06/2014   Osteoporosis    Osteoporosis 12/05/2009   Qualifier: Diagnosis of  By: Charlett Blake MD, Stacey     Preventative health care 08/06/2014   Rash 10/16/2016    Past Surgical History:  Procedure Laterality Date   NO PAST SURGERIES       Current Outpatient Medications  Medication Sig Dispense Refill   ALPRAZolam (XANAX) 0.25 MG tablet Take 1 tablet (0.25 mg total) by mouth 2 (two) times daily as needed for anxiety. 30 tablet 3   estradiol (ESTRACE VAGINAL) 0.1 MG/GM vaginal cream Place [id number] Applicatorfuls vaginally 2 (two) times a week. Apply a thin layer on vulva 2 times a week as needed. 42.5 g 4   estradiol (VIVELLE-DOT) 0.05 MG/24HR patch Place 1 patch (0.05 mg total) onto the skin 2 (two) times a week. 24 patch 4   levothyroxine (SYNTHROID, LEVOTHROID) 75 MCG  tablet Take 75 mcg by mouth daily before breakfast.     ONE TOUCH ULTRA TEST test strip 1 strip.  4   progesterone (PROMETRIUM) 100 MG capsule Take 1 capsule (100 mg total) by mouth at bedtime. 90 capsule 4   risedronate (ACTONEL) 35 MG tablet Take 1 tablet (35 mg total) by mouth once a week. osteoporosis 12 tablet 4   rosuvastatin (CRESTOR) 5 MG tablet Take 5 mg by mouth 3 (three) times a week.     Vitamin D, Ergocalciferol, (DRISDOL) 50000 units CAPS capsule Take 50,000 Units by mouth every 7 (seven) days.     No current facility-administered  medications for this visit.    Allergies:   Codeine and Penicillins    Social History:  The patient  reports that she has never smoked. She has never used smokeless tobacco. She reports current alcohol use of about 2.0 standard drinks per week. She reports that she does not use drugs.   Family History:  The patient's family history includes Allergies in her son; Atrial fibrillation in her mother; Cancer in her maternal grandmother and paternal grandfather; Colon cancer (age of onset: 70) in her maternal grandmother; Coronary artery disease in her maternal grandmother; Dementia in her father, mother, and paternal grandmother; Diabetes in her father; Emphysema in her paternal grandfather; Fibroids in her mother; Heart attack in her maternal grandmother; Heart disease in her maternal grandfather; Heart disease (age of onset: 74) in her maternal uncle; Heart disease (age of onset: 21) in her maternal uncle; Heart disease (age of onset: 24) in her maternal aunt; Hyperlipidemia in her mother; Hypertension in her father and mother; Melanoma in her father; Memory loss in her father; Ovarian cysts in her sister; Parkinson's disease in her father; Parkinsonism in her father; Stroke in her mother and paternal grandmother.    ROS:   Please see the history of present illness. (+) Stress (+) Seasonal allergies All other systems are reviewed and negative.    PHYSICAL EXAM:  VS:  BP 109/63 (BP Location: Left Arm, Patient Position: Sitting, Cuff Size: Normal)   Pulse 69   Ht 5' (1.524 m)   Wt 110 lb 12.8 oz (50.3 kg)   LMP 01/14/2011   BMI 21.64 kg/m  , BMI Body mass index is 21.64 kg/m. GENERAL:  Well appearing HEENT: Pupils equal round and reactive, fundi not visualized, oral mucosa unremarkable NECK:  No jugular venous distention, waveform within normal limits, carotid upstroke brisk and symmetric, no bruits LUNGS:  Clear to auscultation bilaterally HEART:  RRR.  PMI not displaced or sustained,S1  and S2 within normal limits, no S3, no S4, no clicks, no rubs, no murmurs ABD:  Flat, positive bowel sounds normal in frequency in pitch, no bruits, no rebound, no guarding, no midline pulsatile mass, no hepatomegaly, no splenomegaly EXT:  2 plus pulses throughout, no edema, no cyanosis no clubbing SKIN:  No rashes no nodules NEURO:  Cranial nerves II through XII grossly intact, motor grossly intact throughout PSYCH:  Cognitively intact, oriented to person place and time   CT Coronary Calcium Score  01/22/2017: FINDINGS: Non-cardiac: See separate report from [facility].   Ascending Aorta:  Normal 2.5 cm   Pericardium: Normal   Coronary arteries:  No calcium detected   IMPRESSION: Coronary calcium score of 0.   EKG:  EKG is personally reviewed. 07/08/2021:  Sinus rhythm. Rate 69 bpm. Low voltage. 06/06/19: Sinus bradycardia.  Rate 57 bpm.  Low voltage. 01/14/17: sinus rhythm.  Rate  70 bpm.   Recent Labs: No results found for requested labs within last 8760 hours.    Lipid Panel    Component Value Date/Time   CHOL 217 (A) 05/12/2020 0000   TRIG 87 05/12/2020 0000   HDL 86 (A) 05/12/2020 0000   CHOLHDL 3 01/16/2017 0913   VLDL 13.2 01/16/2017 0913   LDLCALC 114 05/12/2020 0000      Wt Readings from Last 3 Encounters:  07/08/21 110 lb 12.8 oz (50.3 kg)  06/04/21 108 lb 6.4 oz (49.2 kg)  05/31/21 109 lb (49.4 kg)      ASSESSMENT AND PLAN:  MIXED HYPERLIPIDEMIA She had lipids checked with her endocrinologist.  She recently started on a statin and is supposed to get lipids rechecked next month.  Calcium score was 0 in 2018.  We discussed the possibility of repeating it given that it has been 5 years.  However given that she is already on a statin, I do not think there is any utility.  Continue with current management.  LDL goal at least less than 100, if not less than 70.  Recommended that she keep working on try to get at least 150 minutes weekly of exercise.   She is working on improving her diet again now that she has less family stressors.   Current medicines are reviewed at length with the patient today.  The patient does not have concerns regarding medicines.  The following changes have been made:  no change  Labs/ tests ordered today include:   Orders Placed This Encounter  Procedures   EKG 12-Lead     Disposition:   FU with Annella Prowell C. Oval Linsey, MD, [facility] in 1 year    I,Mathew Stumpf,acting as a scribe for Skeet Latch, MD.,have documented all relevant documentation on the behalf of Skeet Latch, MD,as directed by  Skeet Latch, MD while in the presence of Skeet Latch, MD.  I, [place] Oval Linsey, MD have reviewed all documentation for this visit.  The documentation of the exam, diagnosis, procedures, and orders on 07/08/2021 are all accurate and complete.   Signed, Braiden Rodman C. Oval Linsey, MD, [facility]  07/08/2021 10:41 AM    [facility]

## 2021-07-08 NOTE — Assessment & Plan Note (Addendum)
She had lipids checked with her endocrinologist.  She recently started on a statin and is supposed to get lipids rechecked next month.  Calcium score was 0 in 2018.  We discussed the possibility of repeating it given that it has been 5 years.  However given that she is already on a statin, I do not think there is any utility.  Continue with current management.  LDL goal at least less than 100, if not less than 70.  Recommended that she keep working on try to get at least 150 minutes weekly of exercise.  She is working on improving her diet again now that she has less family stressors.

## 2021-11-28 ENCOUNTER — Other Ambulatory Visit: Payer: Self-pay | Admitting: Family Medicine

## 2021-11-28 MED ORDER — ALPRAZOLAM 0.25 MG PO TABS: 0.2500 mg | ORAL_TABLET | Freq: Two times a day (BID) | ORAL | 0 refills | Status: DC | PRN

## 2021-12-19 ENCOUNTER — Encounter (HOSPITAL_BASED_OUTPATIENT_CLINIC_OR_DEPARTMENT_OTHER): Payer: Self-pay | Admitting: Family Medicine

## 2021-12-19 ENCOUNTER — Other Ambulatory Visit (HOSPITAL_BASED_OUTPATIENT_CLINIC_OR_DEPARTMENT_OTHER): Payer: Self-pay | Admitting: Family Medicine

## 2021-12-19 DIAGNOSIS — Z1231 Encounter for screening mammogram for malignant neoplasm of breast: Secondary | ICD-10-CM

## 2021-12-30 ENCOUNTER — Ambulatory Visit (HOSPITAL_BASED_OUTPATIENT_CLINIC_OR_DEPARTMENT_OTHER)
Admission: RE | Admit: 2021-12-30 | Discharge: 2021-12-30 | Disposition: A | Discharge status: Home / Self Care | Payer: 59 | Source: Ambulatory Visit | Attending: Family Medicine | Admitting: Family Medicine

## 2021-12-30 ENCOUNTER — Encounter (HOSPITAL_BASED_OUTPATIENT_CLINIC_OR_DEPARTMENT_OTHER): Payer: Self-pay

## 2021-12-30 ENCOUNTER — Ambulatory Visit (HOSPITAL_BASED_OUTPATIENT_CLINIC_OR_DEPARTMENT_OTHER)
Admission: RE | Admit: 2021-12-30 | Discharge: 2021-12-30 | Disposition: A | Discharge status: Home / Self Care | Payer: 59 | Source: Ambulatory Visit | Attending: Obstetrics & Gynecology | Admitting: Obstetrics & Gynecology

## 2021-12-30 DIAGNOSIS — Z1231 Encounter for screening mammogram for malignant neoplasm of breast: Secondary | ICD-10-CM | POA: Diagnosis present

## 2021-12-30 DIAGNOSIS — M810 Age-related osteoporosis without current pathological fracture: Secondary | ICD-10-CM

## 2022-01-03 ENCOUNTER — Ambulatory Visit
Admission: RE | Admit: 2022-01-03 | Discharge: 2022-01-03 | Disposition: A | Discharge status: Home / Self Care | Payer: 59 | Source: Ambulatory Visit | Attending: Emergency Medicine | Admitting: Emergency Medicine

## 2022-01-03 VITALS — BP 124/82 | HR 58 | Temp 98.1°F | Resp 16

## 2022-01-03 DIAGNOSIS — H6993 Unspecified Eustachian tube disorder, bilateral: Secondary | ICD-10-CM

## 2022-01-03 DIAGNOSIS — H8190 Unspecified disorder of vestibular function, unspecified ear: Secondary | ICD-10-CM

## 2022-01-03 DIAGNOSIS — J0141 Acute recurrent pansinusitis: Secondary | ICD-10-CM | POA: Diagnosis not present

## 2022-01-03 MED ORDER — METHYLPREDNISOLONE 8 MG PO TABS: 16.0000 mg | ORAL_TABLET | Freq: Every day | ORAL | 0 refills | Status: AC

## 2022-01-03 MED ORDER — CEFDINIR 300 MG PO CAPS: 300.0000 mg | ORAL_CAPSULE | Freq: Two times a day (BID) | ORAL | 0 refills | Status: AC

## 2022-01-03 NOTE — ED Triage Notes (Signed)
Pt reports having vertigo episodes, headache and congestion.  Started: April   Home interventions: mucinex

## 2022-01-03 NOTE — Discharge Instructions (Addendum)
After reviewing your records, you were initially treated at a [facility] urgent care for "peripheral vertigo" with meclizine, which is an antihistamine, and 3 days of steroids.  You followed up with your [facility] provider a little over 2 weeks later where you were diagnosed with pansinusitis and prescribed antibiotics and steroids.  Because you have never been formally been diagnosed with vertigo or evaluated for possible underlying causes of vertigo, I believe the most logical thing to do today is to treat you for probable recurrent pansinusitis and eustachian tube dysfunction with another round of antibiotics and steroids.  If you do not have relief of your symptoms in the next 5 to 7 days, please follow-up with your primary care provider for further evaluation of vertigo.  Thank you for visiting urgent care today.

## 2022-01-03 NOTE — ED Provider Notes (Signed)
[facility]    CSN: [id number] Arrival date & time: 01/03/22  1619    HISTORY   Chief Complaint  Patient presents with   Dizziness    Vertigo.  Have experienced before.  Congestion dizziness balance issues. Last time treated with 2 rounds of steroids, - Entered by patient   HPI Kelli Barry is a pleasant, 56 y.o. female who presents to urgent care today. Patient complains of episodes of vertigo which she has had in the past.  Patient states she is also been having headache and congestion.  Patient states the vertigo initially began in April of this year, states she had to be treated with 2 rounds of steroids at that time.  Patient states she has been taking Mucinex for symptoms.  EMR reviewed, patient has a history of type 2 diabetes, anxiety, hypothyroidism and seasonal allergies.   Dizziness  Past Medical History:  Diagnosis Date   Abdominal pain 08/06/2014   Acute bronchitis 12/15/2010   Allergy    seasonal   Anxiety 10/16/2016   Diabetes mellitus without complication ([facility])    diet controlled   Diabetes type 2, controlled ([facility]) 08/06/2014   Follows with Dr Talmage Nap   Hyperlipidemia    Hypothyroidism 08/06/2014   Osteoporosis    Osteoporosis 12/05/2009   Qualifier: Diagnosis of  By: Abner Greenspan MD, Stacey     Preventative health care 08/06/2014   Rash 10/16/2016   Patient Active Problem List   Diagnosis Date Noted   Vitamin B12 deficiency 12/12/2018   Anxiety 10/16/2016   Rash 10/16/2016   Hypothyroidism 08/06/2014   Diabetes type 2, controlled ([facility]) 08/06/2014   Preventative health care 08/06/2014   Generalized abdominal pain 08/06/2014   Vitamin D deficiency 12/05/2009   MIXED HYPERLIPIDEMIA 12/05/2009   Osteoporosis 12/05/2009   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 Authorizing Provider  ALPRAZolam (XANAX) 0.25 MG tablet Take 1 tablet (0.25 mg total) by mouth 2 (two) times daily as needed for anxiety. 11/28/21   Bradd Canary, MD  estradiol (ESTRACE VAGINAL) 0.1 MG/GM vaginal cream Place 0.25 Applicatorfuls vaginally 2 (two) times a week. Apply a thin layer on vulva 2 times a week as needed. 06/03/21   Genia Del, MD  estradiol (VIVELLE-DOT) 0.05 MG/24HR patch Place 1 patch (0.05 mg total) onto the skin 2 (two) times a week. 06/03/21   Genia Del, MD  levothyroxine (SYNTHROID, LEVOTHROID) 75 MCG tablet Take 75 mcg by mouth daily before breakfast.    [provider]  ONE TOUCH ULTRA TEST test strip 1 strip. 05/05/14   [provider]  progesterone (PROMETRIUM) 100 MG capsule Take 1 capsule (100 mg total) by mouth at bedtime. 05/31/21   Genia Del, MD  risedronate (ACTONEL) 35 MG tablet Take 1 tablet (  35 mg total) by mouth once a week. osteoporosis 05/31/21   Genia Del, MD  rosuvastatin (CRESTOR) 5 MG tablet Take 5 mg by mouth 3 (three) times a week. 05/14/21   [provider]  Vitamin D, Ergocalciferol, (DRISDOL) 50000 units CAPS capsule Take 50,000 Units by mouth every 7 (seven) days.    [provider]    Family History Family History  Problem Relation Age of Onset   Diabetes Father        DM II   Hypertension Father    Melanoma Father    Dementia Father    Memory loss Father    Parkinsonism Father    Parkinson's disease Father        lewy body   Hypertension Mother    Fibroids Mother        uterine   Hyperlipidemia Mother    Stroke Mother    Atrial fibrillation Mother    Dementia Mother    Cancer Maternal Grandmother        colon    Coronary artery disease Maternal Grandmother    Heart attack Maternal Grandmother    Colon cancer Maternal Grandmother 15   Heart disease Maternal Grandfather        MI at roughly 2   Stroke Paternal Grandmother    Dementia Paternal Grandmother    Cancer Paternal Grandfather        Lung   Emphysema Paternal Grandfather    Ovarian cysts Sister    Heart disease Maternal Aunt 4       MI   Heart disease Maternal Uncle 51       ish   Heart disease Maternal Uncle 76       ish, MI   Allergies Son    Social History Social History   Tobacco Use   Smoking status: Never   Smokeless tobacco: Never  Vaping Use   Vaping Use: Never used  Substance Use Topics   Alcohol use: Yes    Alcohol/week: 2.0 standard drinks of alcohol    Types: 2 Glasses of wine per week   Drug use: No   Allergies   Codeine and Penicillins  Review of Systems Review of Systems  Neurological:  Positive for dizziness.   Pertinent findings revealed after performing a 14 point review of systems has been noted in the history of present illness.  Physical Exam Triage Vital Signs ED Triage Vitals  Enc Vitals Group     BP 12/14/20 0827 (!) 147/82     Pulse Rate 12/14/20 0827 72     Resp 12/14/20 0827 18     Temp 12/14/20 0827 98.3 F (36.8 C)     Temp Source 12/14/20 0827 Oral     SpO2 12/14/20 0827 98 %     Weight --      Height --      Head Circumference --      Peak Flow --      Pain Score 12/14/20 0826 5     Pain Loc --      Pain Edu? --      Excl. in GC? --   No data found.  Updated Vital Signs BP 124/82 (BP Location: Right Arm)   Pulse (!) 58   Temp 98.1 F (36.7 C) (Oral)   Resp 16   LMP 01/14/2011   SpO2 100%   Physical Exam Vitals and nursing note reviewed.  Constitutional:      General: She is not in  acute distress.    Appearance: Normal appearance. She is not ill-appearing.  HENT:     Head: Normocephalic and atraumatic.     Salivary Glands: Right salivary gland is not  diffusely enlarged or tender. Left salivary gland is not diffusely enlarged or tender.     Right Ear: Ear canal and external ear normal. No drainage. No middle ear effusion. There is no impacted cerumen. Tympanic membrane is bulging. Tympanic membrane is not injected or erythematous.     Left Ear: Ear canal and external ear normal. No drainage.  No middle ear effusion. There is no impacted cerumen. Tympanic membrane is bulging. Tympanic membrane is not injected or erythematous.     Ears:     Comments: Bilateral EACs normal, both TMs bulging with clear fluid    Nose: Rhinorrhea present. No nasal deformity, septal deviation, signs of injury, laceration, nasal tenderness, mucosal edema or congestion. Rhinorrhea is clear.     Right Nostril: Occlusion present. No foreign body, epistaxis or septal hematoma.     Left Nostril: Occlusion present. No foreign body, epistaxis or septal hematoma.     Right Turbinates: Enlarged, swollen and pale.     Left Turbinates: Enlarged, swollen and pale.     Right Sinus: Maxillary sinus tenderness present. No frontal sinus tenderness.     Left Sinus: Maxillary sinus tenderness present. No frontal sinus tenderness.     Mouth/Throat:     Lips: Pink. No lesions.     Mouth: Mucous membranes are moist. No oral lesions.     Pharynx: Oropharynx is clear. Uvula midline. No posterior oropharyngeal erythema or uvula swelling.     Tonsils: No tonsillar exudate. 0 on the right. 0 on the left.     Comments: Postnasal drip Eyes:     General: Lids are normal. Vision grossly intact.        Right eye: No discharge.        Left eye: No discharge.     Extraocular Movements: Extraocular movements intact.     Conjunctiva/sclera: Conjunctivae normal.     Right eye: Right conjunctiva is not injected.     Left eye: Left conjunctiva is not injected.     Pupils: Pupils are equal, round, and reactive to light.  Neck:     Trachea: Trachea and phonation normal.  Cardiovascular:     Rate  and Rhythm: Normal rate and regular rhythm.     Pulses: Normal pulses.     Heart sounds: Normal heart sounds. No murmur heard.    No friction rub. No gallop.  Pulmonary:     Effort: Pulmonary effort is normal. No accessory muscle usage, prolonged expiration or respiratory distress.     Breath sounds: Normal breath sounds. No stridor, decreased air movement or transmitted upper airway sounds. No decreased breath sounds, wheezing, rhonchi or rales.  Chest:     Chest wall: No tenderness.  Musculoskeletal:        General: Normal range of motion.     Cervical back: Normal range of motion and neck supple. Normal range of motion.  Lymphadenopathy:     Cervical: No cervical adenopathy.  Skin:    General: Skin is warm and dry.     Findings: No erythema or rash.  Neurological:     General: No focal deficit present.     Mental Status: She is alert and oriented to person, place, and time.  Psychiatric:        Mood and Affect: Mood normal.  Behavior: Behavior normal.     Visual Acuity Right Eye Distance:   Left Eye Distance:   Bilateral Distance:    Right Eye Near:   Left Eye Near:    Bilateral Near:     UC Couse / Diagnostics / Procedures:     Radiology No results found.  Procedures Procedures (including critical care time) EKG  Pending results:  Labs Reviewed - No data to display  Medications Ordered in UC: Medications - No data to display  UC Diagnoses / Final Clinical Impressions(s)   I have reviewed the triage vital signs and the nursing notes.  Pertinent labs & imaging results that were available during my care of the patient were reviewed by me and considered in my medical decision making (see chart for details).    Final diagnoses:  Acute recurrent pansinusitis  Eustachian tube dysfunction, bilateral  Vertiginous syndrome   Patient politely declined Dix-Hallpike maneuver for further evaluation for BPPV, states she has not laid down again all day and is  not willing to risk her vertigo symptoms reoccurring at this time because she is driving herself and her family members are out of town.  EMR reviewed, will treat patient empirically for presumed pansinusitis with antibiotics and steroids as she was treated in April of this year when she had resolution of her previous episode of vertiginous symptoms.  Patient advised to follow-up with her PCP if no better in 5 to 7 days.  ED Prescriptions     Medication Sig Dispense Auth. Provider   cefdinir (OMNICEF) 300 MG capsule Take 1 capsule (300 mg total) by mouth 2 (two) times daily for 10 days. 20 capsule Theadora RamaMorgan, Barrett Goldie Scales, PA-C   methylPREDNISolone (MEDROL) 8 MG tablet Take 2 tablets (16 mg total) by mouth daily for 3 days. 6 tablet Theadora RamaMorgan, Hunter Pinkard Scales, PA-C      PDMP not reviewed this encounter.  Pending results:  Labs Reviewed - No data to display  Discharge Instructions:   Discharge Instructions      After reviewing your records, you were initially treated at a [facility] urgent care for "peripheral vertigo" with meclizine, which is an antihistamine, and 3 days of steroids.  You followed up with your [facility] provider a little over 2 weeks later where you were diagnosed with pansinusitis and prescribed antibiotics and steroids.  Because you have never been formally been diagnosed with vertigo or evaluated for possible underlying causes of vertigo, I believe the most logical thing to do today is to treat you for probable recurrent pansinusitis and eustachian tube dysfunction with another round of antibiotics and steroids.  If you do not have relief of your symptoms in the next 5 to 7 days, please follow-up with your primary care provider for further evaluation of vertigo.  Thank you for visiting urgent care today.      Disposition Upon Discharge:  Condition: stable for discharge home  Patient presented with an acute illness with associated systemic symptoms and  significant discomfort requiring urgent management. In my opinion, this is a condition that a prudent lay person (someone who possesses an average knowledge of health and medicine) may potentially expect to result in complications if not addressed urgently such as respiratory distress, impairment of bodily function or dysfunction of bodily organs.   Routine symptom specific, illness specific and/or disease specific instructions were discussed with the patient and/or caregiver at length.   As such, the patient has been evaluated and assessed, work-up was performed and treatment was  provided in alignment with urgent care protocols and evidence based medicine.  Patient/parent/caregiver has been advised that the patient may require follow up for further testing and treatment if the symptoms continue in spite of treatment, as clinically indicated and appropriate.  Patient/parent/caregiver has been advised to return to the [facility] or PCP if no better; to PCP or the Emergency Department if new signs and symptoms develop, or if the current signs or symptoms continue to change or worsen for further workup, evaluation and treatment as clinically indicated and appropriate  The patient will follow up with their current PCP if and as advised. If the patient does not currently have a PCP we will assist them in obtaining one.   The patient may need specialty follow up if the symptoms continue, in spite of conservative treatment and management, for further workup, evaluation, consultation and treatment as clinically indicated and appropriate.   Patient/parent/caregiver verbalized understanding and agreement of plan as discussed.  All questions were addressed during visit.  Please see discharge instructions below for further details of plan.  This office note has been dictated using Teaching laboratory technician.  Unfortunately, this method of dictation can sometimes lead to typographical or grammatical errors.  I  apologize for your inconvenience in advance if this occurs.  Please do not hesitate to reach out to me if clarification is needed.      Theadora Rama Scales, PA-C 01/03/22 1704

## 2022-05-14 LAB — LAB REPORT - SCANNED
A1c: 5.6
EGFR: 96

## 2022-06-03 ENCOUNTER — Ambulatory Visit (INDEPENDENT_AMBULATORY_CARE_PROVIDER_SITE_OTHER): Payer: 59 | Admitting: Obstetrics & Gynecology

## 2022-06-03 ENCOUNTER — Other Ambulatory Visit (HOSPITAL_COMMUNITY)
Admission: RE | Admit: 2022-06-03 | Discharge: 2022-06-03 | Disposition: A | Discharge status: Home / Self Care | Payer: 59 | Source: Ambulatory Visit | Attending: Obstetrics & Gynecology | Admitting: Obstetrics & Gynecology

## 2022-06-03 ENCOUNTER — Encounter: Payer: Self-pay | Admitting: Obstetrics & Gynecology

## 2022-06-03 VITALS — BP 108/72 | HR 90 | Ht 60.25 in | Wt 102.0 lb

## 2022-06-03 DIAGNOSIS — M810 Age-related osteoporosis without current pathological fracture: Secondary | ICD-10-CM

## 2022-06-03 DIAGNOSIS — Z79890 Hormone replacement therapy (postmenopausal): Secondary | ICD-10-CM

## 2022-06-03 DIAGNOSIS — Z01419 Encounter for gynecological examination (general) (routine) without abnormal findings: Secondary | ICD-10-CM

## 2022-06-03 MED ORDER — PROGESTERONE MICRONIZED 100 MG PO CAPS: 100.0000 mg | ORAL_CAPSULE | Freq: Every day | ORAL | 4 refills | Status: DC

## 2022-06-03 MED ORDER — ESTRADIOL 0.05 MG/24HR TD PTTW: 1.0000 | MEDICATED_PATCH | TRANSDERMAL | 4 refills | Status: DC

## 2022-06-03 MED ORDER — ESTRADIOL 0.1 MG/GM VA CREA: 0.2500 | TOPICAL_CREAM | VAGINAL | 4 refills | Status: DC

## 2022-06-03 NOTE — Progress Notes (Signed)
Kelli Barry 02-27-1965 [id number]   History:    57 y.o. G2P2L2 Divorced.  Works in [employer].    RP:  Established patient presenting for annual gyn exam    HPI: Postmenopause, well on hormone replacement therapy x about 10 years.  On Vivelle-Dot 0.05 and Prometrium 100 mg at bedtime.  No PMB.  No pelvic pain.  Not currently sexually active.  Pap 03/2019 Neg.  No h/o abnormal Pap.  Will repeat Pap reflex today. Prefers to continue with Estrace cream for vaginal dryness.  Mild SUI, doing Kegels.  Breasts normal. Mammo 12/2021 Neg. Body mass index 19.76.  Good fitness and healthy nutrition.  On Actonel for osteoporosis, managed with Fam MD. Last BD 12/2021. Taking supplements of vitamin D and calcium. Health labs with family physician.  Colono 2016.   Past medical history,surgical history, family history and social history were all reviewed and documented in the EPIC chart.  Gynecologic History Patient's last menstrual period was 01/14/2011.  Obstetric History OB History  Gravida Para Term Preterm AB Living  SAB IAB Ectopic Multiple Live Births          2    # Outcome Date GA Lbr Len/2nd Weight Sex Delivery Anes PTL Lv  2 Term           1 Term              ROS: A ROS was performed and pertinent positives and negatives are included in the history. GENERAL: No fevers or chills. HEENT: No change in vision, no earache, sore throat or sinus congestion. NECK: No pain or stiffness. CARDIOVASCULAR: No chest pain or pressure. No palpitations. PULMONARY: No shortness of breath, cough or wheeze. GASTROINTESTINAL: No abdominal pain, nausea, vomiting or diarrhea, melena or bright red blood per rectum. GENITOURINARY: No urinary frequency, urgency, hesitancy or dysuria. MUSCULOSKELETAL: No joint or muscle pain, no back pain, no recent trauma. DERMATOLOGIC: No rash, no itching, no lesions. ENDOCRINE: No polyuria, polydipsia, no heat or cold intolerance. No recent change in weight.  HEMATOLOGICAL: No anemia or easy bruising or bleeding. NEUROLOGIC: No headache, seizures, numbness, tingling or weakness. PSYCHIATRIC: No depression, no loss of interest in normal activity or change in sleep pattern.     Exam:   BP 108/72   Pulse 90   Ht 5' 0.25" (1.53 m)   Wt 102 lb (46.3 kg)   LMP 01/14/2011 Comment: not sexually active  SpO2 99%   BMI 19.76 kg/m   Body mass index is 19.76 kg/m.  General appearance : Well developed well nourished female. No acute distress HEENT: Eyes: no retinal hemorrhage or exudates,  Neck supple, trachea midline, no carotid bruits, no thyroidmegaly Lungs: Clear to auscultation, no rhonchi or wheezes, or rib retractions  Heart: Regular rate and rhythm, no murmurs or gallops Breast:Examined in sitting and supine position were symmetrical in appearance, no palpable masses or tenderness,  no skin retraction, no nipple inversion, no nipple discharge, no skin discoloration, no axillary or supraclavicular lymphadenopathy Abdomen: no palpable masses or tenderness, no rebound or guarding Extremities: no edema or skin discoloration or tenderness  Pelvic: Vulva: Normal             Vagina: No gross lesions or discharge  Cervix: No gross lesions or discharge.  Pap reflex done.  Uterus  AV, normal size, shape and consistency, non-tender and mobile  Adnexa  Without masses or tenderness  Anus: Normal   Assessment/Plan:  57 y.o. female for annual exam   1. Encounter for routine gynecological examination with Papanicolaou smear of cervix Postmenopause, well on hormone replacement therapy x about 10 years.  On Vivelle-Dot 0.05 and Prometrium 100 mg at bedtime.  No PMB.  No pelvic pain.  Not currently sexually active.  Pap 03/2019 Neg.  No h/o abnormal Pap.  Will repeat Pap reflex today. Prefers to continue with Estrace cream for vaginal dryness.  Mild SUI, doing Kegels.  Breasts normal. Mammo 12/2021 Neg. Body mass index 19.76.  Good fitness and healthy  nutrition.  On Actonel for osteoporosis, managed with Fam MD. Last BD 12/2021. Taking supplements of vitamin D and calcium. Health labs with family physician.  Colono 2016. - Cytology - PAP( [place])  2. Post-menopause on HRT (hormone replacement therapy) Postmenopause, well on hormone replacement therapy x about 10 years.  On Vivelle-Dot 0.05 and Prometrium 100 mg at bedtime.  No PMB.  No pelvic pain.  Not currently sexually active.  Using Estradiol cream vaginally twice a week. Prescriptions sent to pharmacy.  3. Age-related osteoporosis without current pathological fracture Body mass index 19.76.  Good fitness and healthy nutrition.  On Actonel for osteoporosis, managed with Fam MD. Last BD 12/2021. Taking supplements of vitamin D and calcium.   Other orders - bimatoprost (LATISSE) 0.03 % ophthalmic solution - Blood Glucose Monitoring Suppl (ONETOUCH VERIO REFLECT) w/Device KIT - desonide (DESOWEN) 0.05 % ointment; Apply 1 Application topically 2 (two) times daily. - Multiple Vitamin (MULTIVITAMIN PO); Take by mouth. - progesterone (PROMETRIUM) 100 MG capsule; Take 1 capsule (100 mg total) by mouth at bedtime. - estradiol (ESTRACE VAGINAL) 0.1 MG/GM vaginal cream; Place 0.25 Applicatorfuls vaginally 2 (two) times a week. Apply a thin layer on vulva 2 times a week as needed. - estradiol (VIVELLE-DOT) 0.05 MG/24HR patch; Place 1 patch (0.05 mg total) onto the skin 2 (two) times a week.   Genia Del MD, 9:42 AM

## 2022-06-06 LAB — CYTOLOGY - PAP
Comment: NEGATIVE
Diagnosis: UNDETERMINED — AB
High risk HPV: NEGATIVE

## 2022-06-08 NOTE — Assessment & Plan Note (Signed)
Supplement and monitor 

## 2022-06-08 NOTE — Assessment & Plan Note (Signed)
Stable on current meds, uses Alprazolam infrequently. No changes

## 2022-06-08 NOTE — Assessment & Plan Note (Signed)
On Levothyroxine, continue to monitor 

## 2022-06-08 NOTE — Assessment & Plan Note (Signed)
Follows with endocrinology. hgba1c acceptable, minimize simple carbs. Increase exercise as tolerated. Continue current meds 

## 2022-06-08 NOTE — Assessment & Plan Note (Signed)
Encouraged to get adequate exercise, calcium and vitamin d intake. Tolerating Fosamax but forgets to take it frequently will switch to Actonel monthly to see if she can manage to remember that better

## 2022-06-08 NOTE — Assessment & Plan Note (Signed)
Encouraged heart healthy diet, increase exercise, avoid trans fats, consider a krill oil cap daily. She is following with endocrinology, records requeste

## 2022-06-08 NOTE — Assessment & Plan Note (Signed)
Patient encouraged to maintain heart healthy diet, regular exercise, adequate sleep. Consider daily probiotics. Take medications as prescribed. Follows with GYN last pap 2024 was mildly abnormal ASCUS.Marland Kitchen MGM 12/2021 repeat next year. Dexa 12/2021 repeat in 2025. Colonoscopy 2016 repeat 2026. Labs reviewed

## 2022-06-09 ENCOUNTER — Encounter: Payer: Self-pay | Admitting: Family Medicine

## 2022-06-09 ENCOUNTER — Ambulatory Visit (INDEPENDENT_AMBULATORY_CARE_PROVIDER_SITE_OTHER): Payer: 59 | Admitting: Family Medicine

## 2022-06-09 VITALS — BP 105/64 | HR 88 | Temp 98.0°F | Resp 16 | Ht 61.0 in | Wt 104.4 lb

## 2022-06-09 DIAGNOSIS — Z0000 Encounter for general adult medical examination without abnormal findings: Secondary | ICD-10-CM

## 2022-06-09 DIAGNOSIS — Z0001 Encounter for general adult medical examination with abnormal findings: Secondary | ICD-10-CM | POA: Diagnosis not present

## 2022-06-09 DIAGNOSIS — M549 Dorsalgia, unspecified: Secondary | ICD-10-CM | POA: Diagnosis not present

## 2022-06-09 DIAGNOSIS — M542 Cervicalgia: Secondary | ICD-10-CM

## 2022-06-09 DIAGNOSIS — E559 Vitamin D deficiency, unspecified: Secondary | ICD-10-CM

## 2022-06-09 DIAGNOSIS — F419 Anxiety disorder, unspecified: Secondary | ICD-10-CM

## 2022-06-09 DIAGNOSIS — E039 Hypothyroidism, unspecified: Secondary | ICD-10-CM

## 2022-06-09 DIAGNOSIS — M810 Age-related osteoporosis without current pathological fracture: Secondary | ICD-10-CM

## 2022-06-09 DIAGNOSIS — E538 Deficiency of other specified B group vitamins: Secondary | ICD-10-CM

## 2022-06-09 DIAGNOSIS — E119 Type 2 diabetes mellitus without complications: Secondary | ICD-10-CM | POA: Diagnosis not present

## 2022-06-09 DIAGNOSIS — C439 Malignant melanoma of skin, unspecified: Secondary | ICD-10-CM | POA: Insufficient documentation

## 2022-06-09 DIAGNOSIS — Z85820 Personal history of malignant melanoma of skin: Secondary | ICD-10-CM

## 2022-06-09 DIAGNOSIS — E782 Mixed hyperlipidemia: Secondary | ICD-10-CM

## 2022-06-09 DIAGNOSIS — Z79899 Other long term (current) drug therapy: Secondary | ICD-10-CM

## 2022-06-09 MED ORDER — RISEDRONATE SODIUM 150 MG PO TABS: 150.0000 mg | ORAL_TABLET | ORAL | 1 refills | Status: DC

## 2022-06-09 MED ORDER — TIZANIDINE HCL 2 MG PO TABS: 1.0000 mg | ORAL_TABLET | Freq: Three times a day (TID) | ORAL | 0 refills | Status: AC | PRN

## 2022-06-09 NOTE — Progress Notes (Signed)
Subjective:   By signing my name below, I, Kelli Barry, attest that this documentation has been prepared under the direction and in the presence of Kelli Canary, MD. 06/09/2022   Patient ID: Kelli Barry, female    DOB: 1965/11/25, 57 y.o.   MRN: [id number]  No chief complaint on file.   HPI Patient is in today for a comprehensive physical exam.   Acute She denies any chest pain, palpitations, cough, SOB, or recent illness. She denies any urinary or bowel issues.   Cholesterol Her cholesterol was elevated during her last blood work. She is taking 5 mg Crestor 3x weekly to manage it.  Lab Results  Component Value Date   CHOL 217 (A) 05/12/2020   HDL 86 (A) 05/12/2020   LDLCALC 114 05/12/2020   TRIG 87 05/12/2020   CHOLHDL 3 01/16/2017    Vitamin D She is taking 50,000 units ergocalciferol daily PO.  Stress She has elevated work and family stress. She also has increased difficulty sleeping. She takes 0.25 mg xanax as needed to manage it. She is requesting a refill for the xanax during this visit.   Scoliosis She has a history of scoliosis in her neck and has pain in her back and neck. She is interested in seeking treatment for it.   Glucose  She is going to receive a glucose monitor to better understand her blood sugar patterns.  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  Colonoscopy Last colonoscopy completed on 03/03/2014. Results are normal. Repeat in 10 years.   Dexa Last Dexa completed on 12/30/2021. The BMD measured at Femur Neck Left is 0.634 g/cm2 with a T-score of -2.9. This patient is considered osteoporotic according to World Health Organization [facility]) criteria. She is taking fosamax currently, but forgets to take it daily. She is interested in receiving a once a year injection instead of taking medication to manage it.   Pap Smear Last pap smear completed on 06/03/2022. Atypical squamous cells of unknown significance  found. Within the normal reference range HPV-negative. Repeat in 3 years.   Mammogram Last mammogram completed on 12/31/2021. Results are normal. Repeat in 1 year.   Social history Her son was recently diagnosis with Crohn's disease at 62 years old. Her mother is 87 years old. Her sister is 82 years old.   Advanced directives Advanced directives discussed with her during this visit.   Immunizations She has not yet received the shingles immunization.   Exercise She recently joined a gym and has been lifting weights.   Dermatology She is UTD on dermatology.   Dental She is UTD on dental.  Past Medical History:  Diagnosis Date   Abdominal pain 08/06/2014   Acute bronchitis 12/15/2010   Allergy    seasonal   Anxiety 10/16/2016   Diabetes mellitus without complication    diet controlled   Diabetes type 2, controlled 08/06/2014   Follows with Dr Kelli Barry   Hyperlipidemia    Hypothyroidism 08/06/2014   Osteoporosis    Osteoporosis 12/05/2009   Qualifier: Diagnosis of  By: Kelli Greenspan MD, Kelli Barry     Preventative health care 08/06/2014   Rash 10/16/2016    Past Surgical History:  Procedure Laterality Date   NO PAST SURGERIES      Family History  Problem Relation Age of Onset   Hypertension Mother    Fibroids Mother        uterine   Hyperlipidemia Mother  Stroke Mother    Atrial fibrillation Mother    Dementia Mother    Diabetes Father        DM II   Hypertension Father    Melanoma Father    Dementia Father    Memory loss Father    Parkinsonism Father    Parkinson's disease Father        lewy body   Ovarian cysts Sister    Heart disease Maternal Aunt 46       MI   Heart disease Maternal Uncle 25       ish   Heart disease Maternal Uncle 53       ish, MI   Cancer Maternal Grandmother        colon   Coronary artery disease Maternal Grandmother    Heart attack Maternal Grandmother    Colon cancer Maternal Grandmother 75   Heart disease Maternal Grandfather         MI at roughly 66   Stroke Paternal Grandmother    Dementia Paternal Grandmother    Cancer Paternal Grandfather        Lung   Emphysema Paternal Grandfather    Crohn's disease Son     Social History   Socioeconomic History   Marital status: Divorced    Spouse name: Not on file   Number of children: Not on file   Years of education: Not on file   Highest education level: Not on file  Occupational History   Not on file  Tobacco Use   Smoking status: Never   Smokeless tobacco: Never  Vaping Use   Vaping Use: Never used  Substance and Sexual Activity   Alcohol use: Yes    Alcohol/week: 2.0 standard drinks of alcohol    Types: 2 Glasses of wine per week   Drug use: No   Sexual activity: Not Currently    Birth control/protection: Post-menopausal    Comment: 1st intercourse- 54, partner- less than 5  Other Topics Concern   Not on file  Social History Narrative   Not on file   Social Determinants of Health   Financial Resource Strain: Not on file  Food Insecurity: Not on file  Transportation Needs: Not on file  Physical Activity: Not on file  Stress: Not on file  Social Connections: Not on file  Intimate Partner Violence: Not on file    Outpatient Medications Prior to Visit  Medication Sig Dispense Refill   ALPRAZolam (XANAX) 0.25 MG tablet Take 1 tablet (0.25 mg total) by mouth 2 (two) times daily as needed for anxiety. 30 tablet 0   bimatoprost (LATISSE) 0.03 % ophthalmic solution      Blood Glucose Monitoring Suppl (ONETOUCH VERIO REFLECT) w/Device KIT      desonide (DESOWEN) 0.05 % ointment Apply 1 Application topically 2 (two) times daily.     estradiol (ESTRACE VAGINAL) 0.1 MG/GM vaginal cream Place 0.25 Applicatorfuls vaginally 2 (two) times a week. Apply a thin layer on vulva 2 times a week as needed. 42.5 g 4   estradiol (VIVELLE-DOT) 0.05 MG/24HR patch Place 1 patch (0.05 mg total) onto the skin 2 (two) times a week. 24 patch 4   levothyroxine (SYNTHROID,  LEVOTHROID) 75 MCG tablet Take 75 mcg by mouth daily before breakfast.     Multiple Vitamin (MULTIVITAMIN PO) Take by mouth.     ONE TOUCH ULTRA TEST test strip 1 strip.  4   progesterone (PROMETRIUM) 100 MG capsule Take 1 capsule (100 mg total)  by mouth at bedtime. 90 capsule 4   risedronate (ACTONEL) 35 MG tablet Take 1 tablet (35 mg total) by mouth once a week. osteoporosis 12 tablet 4   rosuvastatin (CRESTOR) 5 MG tablet Take 5 mg by mouth 3 (three) times a week.     Vitamin D, Ergocalciferol, (DRISDOL) 50000 units CAPS capsule Take 50,000 Units by mouth every 7 (seven) days.     No facility-administered medications prior to visit.    Allergies  Allergen Reactions   Codeine Nausea And Vomiting   Penicillins Nausea And Vomiting    Review of Systems  Constitutional:        (-) recent illness  Respiratory:  Negative for shortness of breath.   Cardiovascular:  Negative for chest pain and palpitations.  Gastrointestinal:        (-) bowel issues  Genitourinary:        (-) urinary issues       Objective:    Physical Exam Constitutional:      General: She is not in acute distress.    Appearance: Normal appearance.  HENT:     Head: Normocephalic and atraumatic.     Right Ear: Tympanic membrane, ear canal and external ear normal.     Left Ear: Tympanic membrane, ear canal and external ear normal.  Eyes:     Extraocular Movements: Extraocular movements intact.     Right eye: No nystagmus.     Left eye: No nystagmus.     Pupils: Pupils are equal, round, and reactive to light.  Cardiovascular:     Rate and Rhythm: Normal rate and regular rhythm.     Heart sounds: Normal heart sounds. No murmur heard.    No gallop.  Pulmonary:     Effort: Pulmonary effort is normal. No respiratory distress.     Breath sounds: Normal breath sounds. No wheezing or rales.  Abdominal:     General: Bowel sounds are normal. There is no distension.     Palpations: Abdomen is soft.     Tenderness:  There is no abdominal tenderness. There is no guarding.  Musculoskeletal:     Cervical back: Normal range of motion.     Comments: (+) 5/5 strength in upper and lower extremities  Lymphadenopathy:     Cervical: No cervical adenopathy.  Skin:    General: Skin is warm.  Neurological:     Mental Status: She is alert and oriented to person, place, and time.     Deep Tendon Reflexes:     Reflex Scores:      Patellar reflexes are 2+ on the right side and 2+ on the left side. Psychiatric:        Judgment: Judgment normal.     LMP 01/14/2011 Comment: not sexually active Wt Readings from Last 3 Encounters:  06/03/22 102 lb (46.3 kg)  07/08/21 110 lb 12.8 oz (50.3 kg)  06/04/21 108 lb 6.4 oz (49.2 kg)       Assessment & Plan:  Controlled type 2 diabetes mellitus without complication, without long-term current use of insulin  Anxiety  Age-related osteoporosis without current pathological fracture  Vitamin B12 deficiency  Vitamin D deficiency  Mixed hyperlipidemia  Hypothyroidism, unspecified type  Preventative health care    I, Kelli Barry, personally preformed the services described in this documentation.  All medical record entries made by the scribe were at my direction and in my presence.  I have reviewed the chart and discharge instructions (if applicable) and agree  that the record reflects my personal performance and is accurate and complete. 06/09/2022  Kelli Barry  [facility] as a scribe for Danise Edge, MD.,have documented all relevant documentation on the behalf of Danise Edge, MD,as directed by  Danise Edge, MD while in the presence of Danise Edge, MD.

## 2022-06-09 NOTE — Patient Instructions (Addendum)
Magnesium Glycinate 200-400 mg at bed L Tryptophan capsules Pendulum probiotic. Polyphenol booster   Shingrix is the new shingles shot, 2 shots over 2-6 months, confirm coverage with insurance and document, then can return here for shots with nurse appt or at pharmacy    Preventive Care 1-59 Years Old, Female Preventive care refers to lifestyle choices and visits with your health care provider that can promote health and wellness. Preventive care visits are also called wellness exams. What can I expect for my preventive care visit? Counseling Your health care provider may ask you questions about your: Medical history, including: Past medical problems. Family medical history. Pregnancy history. Current health, including: Menstrual cycle. Method of birth control. Emotional well-being. Home life and relationship well-being. Sexual activity and sexual health. Lifestyle, including: Alcohol, nicotine or tobacco, and drug use. Access to firearms. Diet, exercise, and sleep habits. Work and work Astronomer. Sunscreen use. Safety issues such as seatbelt and bike helmet use. Physical exam Your health care provider will check your: Height and weight. These may be used to calculate your BMI (body mass index). BMI is a measurement that tells if you are at a healthy weight. Waist circumference. This measures the distance around your waistline. This measurement also tells if you are at a healthy weight and may help predict your risk of certain diseases, such as type 2 diabetes and high blood pressure. Heart rate and blood pressure. Body temperature. Skin for abnormal spots. What immunizations do I need?  Vaccines are usually given at various ages, according to a schedule. Your health care provider will recommend vaccines for you based on your age, medical history, and lifestyle or other factors, such as travel or where you work. What tests do I need? Screening Your health care provider may  recommend screening tests for certain conditions. This may include: Lipid and cholesterol levels. Diabetes screening. This is done by checking your blood sugar (glucose) after you have not eaten for a while (fasting). Pelvic exam and Pap test. Hepatitis B test. Hepatitis C test. HIV (human immunodeficiency virus) test. STI (sexually transmitted infection) testing, if you are at risk. Lung cancer screening. Colorectal cancer screening. Mammogram. Talk with your health care provider about when you should start having regular mammograms. This may depend on whether you have a family history of breast cancer. BRCA-related cancer screening. This may be done if you have a family history of breast, ovarian, tubal, or peritoneal cancers. Bone density scan. This is done to screen for osteoporosis. Talk with your health care provider about your test results, treatment options, and if necessary, the need for more tests. Follow these instructions at home: Eating and drinking  Eat a diet that includes fresh fruits and vegetables, whole grains, lean protein, and low-fat dairy products. Take vitamin and mineral supplements as recommended by your health care provider. Do not drink alcohol if: Your health care provider tells you not to drink. You are pregnant, may be pregnant, or are planning to become pregnant. If you drink alcohol: Limit how much you have to 0-1 drink a day. Know how much alcohol is in your drink. In the [place] one drink equals one 12 oz bottle of beer (355 mL), one 5 oz glass of wine (148 mL), or one 1 oz glass of hard liquor (44 mL). Lifestyle Brush your teeth every morning and night with fluoride toothpaste. Floss one time each day. Exercise for at least 30 minutes 5 or more days each week. Do not use any products that  contain nicotine or tobacco. These products include cigarettes, chewing tobacco, and vaping devices, such as e-cigarettes. If you need help quitting, ask your health  care provider. Do not use drugs. If you are sexually active, practice safe sex. Use a condom or other form of protection to prevent STIs. If you do not wish to become pregnant, use a form of birth control. If you plan to become pregnant, see your health care provider for a prepregnancy visit. Take aspirin only as told by your health care provider. Make sure that you understand how much to take and what form to take. Work with your health care provider to find out whether it is safe and beneficial for you to take aspirin daily. Find healthy ways to manage stress, such as: Meditation, yoga, or listening to music. Journaling. Talking to a trusted person. Spending time with friends and family. Minimize exposure to UV radiation to reduce your risk of skin cancer. Safety Always wear your seat belt while driving or riding in a vehicle. Do not drive: If you have been drinking alcohol. Do not ride with someone who has been drinking. When you are tired or distracted. While texting. If you have been using any mind-altering substances or drugs. Wear a helmet and other protective equipment during sports activities. If you have firearms in your house, make sure you follow all gun safety procedures. Seek help if you have been physically or sexually abused. What's next? Visit your health care provider once a year for an annual wellness visit. Ask your health care provider how often you should have your eyes and teeth checked. Stay up to date on all vaccines. This information is not intended to replace advice given to you by your health care provider. Make sure you discuss any questions you have with your health care provider. Document Revised: 08/01/2020 Document Reviewed: 08/01/2020 Elsevier Patient Education  2023 [employer].

## 2022-06-09 NOTE — Assessment & Plan Note (Signed)
Removed by dermatology this year. [facility], sees them annually

## 2022-06-09 NOTE — Telephone Encounter (Signed)
Labs printed put in pt chart

## 2022-06-10 ENCOUNTER — Encounter: Payer: Self-pay | Admitting: Family Medicine

## 2022-06-10 DIAGNOSIS — Z79899 Other long term (current) drug therapy: Secondary | ICD-10-CM | POA: Insufficient documentation

## 2022-06-10 DIAGNOSIS — M549 Dorsalgia, unspecified: Secondary | ICD-10-CM | POA: Insufficient documentation

## 2022-06-10 DIAGNOSIS — M542 Cervicalgia: Secondary | ICD-10-CM | POA: Insufficient documentation

## 2022-06-10 NOTE — Assessment & Plan Note (Signed)
And generalized back pain with a history of scoliosis. Will run a complete scoliosis xray series to confirm degree of scoliosis and decide how to proceed after that. Encouraged moist heat and gentle stretching as tolerated. May try NSAIDs and prescription meds as directed and report if symptoms worsen or seek immediate care

## 2022-09-05 ENCOUNTER — Encounter: Payer: Self-pay | Admitting: Family Medicine

## 2022-10-21 ENCOUNTER — Ambulatory Visit (HOSPITAL_BASED_OUTPATIENT_CLINIC_OR_DEPARTMENT_OTHER)
Admission: RE | Admit: 2022-10-21 | Discharge: 2022-10-21 | Disposition: A | Discharge status: Home / Self Care | Payer: 59 | Source: Ambulatory Visit | Attending: Family Medicine | Admitting: Family Medicine

## 2022-10-21 DIAGNOSIS — M549 Dorsalgia, unspecified: Secondary | ICD-10-CM | POA: Diagnosis present

## 2022-10-21 DIAGNOSIS — M542 Cervicalgia: Secondary | ICD-10-CM | POA: Diagnosis present

## 2022-12-08 ENCOUNTER — Encounter: Payer: Self-pay | Admitting: Family Medicine

## 2022-12-08 ENCOUNTER — Other Ambulatory Visit: Payer: Self-pay | Admitting: Family Medicine

## 2022-12-08 DIAGNOSIS — M549 Dorsalgia, unspecified: Secondary | ICD-10-CM

## 2023-02-13 ENCOUNTER — Other Ambulatory Visit (HOSPITAL_BASED_OUTPATIENT_CLINIC_OR_DEPARTMENT_OTHER): Payer: Self-pay | Admitting: Family Medicine

## 2023-02-13 DIAGNOSIS — Z139 Encounter for screening, unspecified: Secondary | ICD-10-CM

## 2023-02-17 ENCOUNTER — Encounter (HOSPITAL_BASED_OUTPATIENT_CLINIC_OR_DEPARTMENT_OTHER): Payer: Self-pay

## 2023-02-17 ENCOUNTER — Ambulatory Visit (HOSPITAL_BASED_OUTPATIENT_CLINIC_OR_DEPARTMENT_OTHER)
Admission: RE | Admit: 2023-02-17 | Discharge: 2023-02-17 | Disposition: A | Discharge status: Home / Self Care | Payer: 59 | Source: Ambulatory Visit | Attending: Family Medicine | Admitting: Family Medicine

## 2023-02-17 DIAGNOSIS — Z139 Encounter for screening, unspecified: Secondary | ICD-10-CM | POA: Diagnosis present

## 2023-02-17 DIAGNOSIS — Z1231 Encounter for screening mammogram for malignant neoplasm of breast: Secondary | ICD-10-CM | POA: Insufficient documentation

## 2023-04-19 ENCOUNTER — Other Ambulatory Visit: Payer: Self-pay | Admitting: Family Medicine

## 2023-05-21 ENCOUNTER — Other Ambulatory Visit: Payer: Self-pay | Admitting: Endocrinology

## 2023-05-21 DIAGNOSIS — E049 Nontoxic goiter, unspecified: Secondary | ICD-10-CM

## 2023-06-03 ENCOUNTER — Encounter: Payer: Self-pay | Admitting: Radiology

## 2023-06-03 ENCOUNTER — Ambulatory Visit (INDEPENDENT_AMBULATORY_CARE_PROVIDER_SITE_OTHER): Payer: 59 | Admitting: Radiology

## 2023-06-03 VITALS — BP 104/70 | HR 74 | Ht 61.0 in | Wt 95.2 lb

## 2023-06-03 DIAGNOSIS — Z79890 Hormone replacement therapy (postmenopausal): Secondary | ICD-10-CM

## 2023-06-03 DIAGNOSIS — Z1331 Encounter for screening for depression: Secondary | ICD-10-CM | POA: Diagnosis not present

## 2023-06-03 DIAGNOSIS — R5382 Chronic fatigue, unspecified: Secondary | ICD-10-CM

## 2023-06-03 DIAGNOSIS — E559 Vitamin D deficiency, unspecified: Secondary | ICD-10-CM

## 2023-06-03 DIAGNOSIS — Z01419 Encounter for gynecological examination (general) (routine) without abnormal findings: Secondary | ICD-10-CM | POA: Diagnosis not present

## 2023-06-03 MED ORDER — ESTRADIOL 0.1 MG/GM VA CREA: 0.5000 | TOPICAL_CREAM | VAGINAL | 4 refills | Status: AC

## 2023-06-03 NOTE — Patient Instructions (Signed)
 Preventive Care 16-60 Years Old, Female  Preventive care refers to lifestyle choices and visits with your health care provider that can promote health and wellness. Preventive care visits are also called wellness exams.  What can I expect for my preventive care visit?  Counseling  Your health care provider may ask you questions about your:  Medical history, including:  Past medical problems.  Family medical history.  Pregnancy history.  Current health, including:  Menstrual cycle.  Method of birth control.  Emotional well-being.  Home life and relationship well-being.  Sexual activity and sexual health.  Lifestyle, including:  Alcohol, nicotine or tobacco, and drug use.  Access to firearms.  Diet, exercise, and sleep habits.  Work and work Astronomer.  Sunscreen use.  Safety issues such as seatbelt and bike helmet use.  Physical exam  Your health care provider will check your:  Height and weight. These may be used to calculate your BMI (body mass index). BMI is a measurement that tells if you are at a healthy weight.  Waist circumference. This measures the distance around your waistline. This measurement also tells if you are at a healthy weight and may help predict your risk of certain diseases, such as type 2 diabetes and high blood pressure.  Heart rate and blood pressure.  Body temperature.  Skin for abnormal spots.  What immunizations do I need?    Vaccines are usually given at various ages, according to a schedule. Your health care provider will recommend vaccines for you based on your age, medical history, and lifestyle or other factors, such as travel or where you work.  What tests do I need?  Screening  Your health care provider may recommend screening tests for certain conditions. This may include:  Lipid and cholesterol levels.  Diabetes screening. This is done by checking your blood sugar (glucose) after you have not eaten for a while (fasting).  Pelvic exam and Pap test.  Hepatitis B test.  Hepatitis C  test.  HIV (human immunodeficiency virus) test.  STI (sexually transmitted infection) testing, if you are at risk.  Lung cancer screening.  Colorectal cancer screening.  Mammogram. Talk with your health care provider about when you should start having regular mammograms. This may depend on whether you have a family history of breast cancer.  BRCA-related cancer screening. This may be done if you have a family history of breast, ovarian, tubal, or peritoneal cancers.  Bone density scan. This is done to screen for osteoporosis.  Talk with your health care provider about your test results, treatment options, and if necessary, the need for more tests.  Follow these instructions at home:  Eating and drinking    Eat a diet that includes fresh fruits and vegetables, whole grains, lean protein, and low-fat dairy products.  Take vitamin and mineral supplements as recommended by your health care provider.  Do not drink alcohol if:  Your health care provider tells you not to drink.  You are pregnant, may be pregnant, or are planning to become pregnant.  If you drink alcohol:  Limit how much you have to 0-1 drink a day.  Know how much alcohol is in your drink. In the [place] one drink equals one 12 oz bottle of beer (355 mL), one 5 oz glass of wine (148 mL), or one 1 oz glass of hard liquor (44 mL).  Lifestyle  Brush your teeth every morning and night with fluoride toothpaste. Floss one time each day.  Exercise for at least  30 minutes 5 or more days each week.  Do not use any products that contain nicotine or tobacco. These products include cigarettes, chewing tobacco, and vaping devices, such as e-cigarettes. If you need help quitting, ask your health care provider.  Do not use drugs.  If you are sexually active, practice safe sex. Use a condom or other form of protection to prevent STIs.  If you do not wish to become pregnant, use a form of birth control. If you plan to become pregnant, see your health care provider for a  prepregnancy visit.  Take aspirin only as told by your health care provider. Make sure that you understand how much to take and what form to take. Work with your health care provider to find out whether it is safe and beneficial for you to take aspirin daily.  Find healthy ways to manage stress, such as:  Meditation, yoga, or listening to music.  Journaling.  Talking to a trusted person.  Spending time with friends and family.  Minimize exposure to UV radiation to reduce your risk of skin cancer.  Safety  Always wear your seat belt while driving or riding in a vehicle.  Do not drive:  If you have been drinking alcohol. Do not ride with someone who has been drinking.  When you are tired or distracted.  While texting.  If you have been using any mind-altering substances or drugs.  Wear a helmet and other protective equipment during sports activities.  If you have firearms in your house, make sure you follow all gun safety procedures.  Seek help if you have been physically or sexually abused.  What's next?  Visit your health care provider once a year for an annual wellness visit.  Ask your health care provider how often you should have your eyes and teeth checked.  Stay up to date on all vaccines.  This information is not intended to replace advice given to you by your health care provider. Make sure you discuss any questions you have with your health care provider.  Document Revised: 08/01/2020 Document Reviewed: 08/01/2020  Elsevier Patient Education  2024 [employer].

## 2023-06-03 NOTE — Progress Notes (Signed)
 Kelli Barry [birth year] [id number]   History: Postmenopausal 58 y.o. presents for annual exam.c/o of increased fatigue, trouble sleeping. Had normal thyroid panel and glucose level at endocrinologist. Symptoms began 2 months ago. On HRT, only uses her patch once a week instead of twice. Only using estrace cream as needed.   Gynecologic History Postmenopausal Last Pap: 2024. Results were: normal Last mammogram: 02/17/23. Results were: normal Last colonoscopy: 2016 DEXA:2023 osteoporosis HRT use: current  Obstetric History OB History  Gravida Para Term Preterm AB Living  2 2 2   2   SAB IAB Ectopic Multiple Live Births      2    # Outcome Date GA Lbr Len/2nd Weight Sex Type Anes PTL Lv  2 Term           1 Term                06/03/2023    1:29 PM 06/09/2022    9:24 AM 03/05/2018    9:54 AM  Depression screen PHQ 2/9  Decreased Interest 0 0 1  Down, Depressed, Hopeless 0 1 1  PHQ - 2 Score 0 1 2  Altered sleeping  1   Tired, decreased energy  0   Change in appetite  0   Feeling bad or failure about yourself   0   Trouble concentrating  0   Moving slowly or fidgety/restless  0   Suicidal thoughts  0   PHQ-9 Score  2   Difficult doing work/chores  Not difficult at all      Current Outpatient Medications:    albuterol (PROVENTIL) (2.5 MG/3ML) 0.083% nebulizer solution, 3 ml as needed Inhalation q 4-6 hours prn cough/wheeze for 90 days, Disp: , Rfl:    albuterol (VENTOLIN HFA) 108 (90 Base) MCG/ACT inhaler, Inhale 2 puffs into the lungs every 4 (four) hours as needed., Disp: , Rfl:    ALPRAZolam (XANAX) 0.25 MG tablet, Take 1 tablet (0.25 mg total) by mouth 2 (two) times daily as needed for anxiety., Disp: 30 tablet, Rfl: 0   Azelastine HCl 137 MCG/SPRAY SOLN, Place 2 sprays into both nostrils 2 (two) times daily., Disp: , Rfl:    bimatoprost (LATISSE) 0.03 % ophthalmic solution, , Disp: , Rfl:    Blood Glucose Monitoring Suppl (ONETOUCH VERIO REFLECT) w/Device KIT, ,  Disp: , Rfl:    desonide (DESOWEN) 0.05 % ointment, Apply 1 Application topically 2 (two) times daily., Disp: , Rfl:    EPINEPHrine 0.3 mg/0.3 mL IJ SOAJ injection, SMARTSIG:injection IM As Directed, Disp: , Rfl:    estradiol (ESTRACE VAGINAL) 0.1 MG/GM vaginal cream, Place 0.25 Applicatorfuls vaginally 2 (two) times a week. Apply a thin layer on vulva 2 times a week as needed., Disp: 42.5 g, Rfl: 4   estradiol (VIVELLE-DOT) 0.05 MG/24HR patch, Place 1 patch (0.05 mg total) onto the skin 2 (two) times a week., Disp: 24 patch, Rfl: 4   fexofenadine (ALLEGRA ALLERGY) 180 MG tablet, 1 tablet Swallow whole with water; do not take with fruit juices. Orally Once a day for 30 day(s), Disp: , Rfl:    fluticasone (FLONASE) 50 MCG/ACT nasal spray, Place 1 spray into both nostrils daily., Disp: , Rfl:    levothyroxine (SYNTHROID, LEVOTHROID) 75 MCG tablet, Take 75 mcg by mouth daily before breakfast., Disp: , Rfl:    Multiple Vitamin (MULTIVITAMIN PO), Take by mouth., Disp: , Rfl:    ONE TOUCH ULTRA TEST test strip, 1 strip., Disp: , Rfl: 4  progesterone (PROMETRIUM) 100 MG capsule, Take 1 capsule (100 mg total) by mouth at bedtime., Disp: 90 capsule, Rfl: 4   risedronate (ACTONEL) 150 MG tablet, Take 1 tablet (150 mg total) by mouth every 30 (thirty) days. with water on empty stomach, nothing by mouth or lie down for next 30 minutes., Disp: 3 tablet, Rfl: 1   rosuvastatin (CRESTOR) 5 MG tablet, Take 5 mg by mouth 3 (three) times a week., Disp: , Rfl:    Vitamin D, Ergocalciferol, (DRISDOL) 50000 units CAPS capsule, Take 50,000 Units by mouth every 7 (seven) days., Disp: , Rfl:    tiZANidine (ZANAFLEX) 2 MG tablet, Take 0.5-2 tablets (1-4 mg total) by mouth every 8 (eight) hours as needed for muscle spasms. (Patient not taking: Reported on 06/03/2023), Disp: 20 tablet, Rfl: 0   The following portions of the patient's history were reviewed and updated as appropriate: allergies, current medications, past family  history, past medical history, past social history, past surgical history, and problem list.  Review of Systems Pertinent items noted in HPI and remainder of comprehensive ROS otherwise negative.  Past medical history, past surgical history, family history and social history were all reviewed and documented in the EPIC chart.  Exam:  Vitals:   06/03/23 1327  BP: 104/70  Pulse: 74  SpO2: 96%  Weight: 95 lb 3.2 oz (43.2 kg)  Height: 5\' 1"  (1.549 m)   Body mass index is 17.99 kg/m.  General appearance:  Normal Thyroid:  Symmetrical, normal in size, without palpable masses or nodularity. Respiratory  Auscultation:  Clear without wheezing or rhonchi Cardiovascular  Auscultation:  Regular rate, without rubs, murmurs or gallops  Edema/varicosities:  Not grossly evident Abdominal  Soft,nontender, without masses, guarding or rebound.  Liver/spleen:  No organomegaly noted  Hernia:  None appreciated  Skin  Inspection:  Grossly normal Breasts: Examined lying and sitting.   Right: Without masses, retractions, nipple discharge or axillary adenopathy.   Left: Without masses, retractions, nipple discharge or axillary adenopathy. Genitourinary   Inguinal/mons:  Normal without inguinal adenopathy  External genitalia:  Normal appearing vulva with no masses, tenderness, or lesions  BUS/Urethra/Skene's glands:  Normal  Vagina:  Normal appearing with normal color and discharge, no lesions. Atrophy: moderate   Cervix:  Normal appearing without discharge or lesions  Uterus:  Normal in size, shape and contour.  Midline and mobile, nontender  Adnexa/parametria:     Rt: Normal in size, without masses or tenderness.   Lt: Normal in size, without masses or tenderness.  Anus and perineum: Normal    Ellis Guys, CMA present for exam  Assessment/Plan:   1. Well woman exam with routine gynecological exam (Primary) Pap 2027 Mammo yearly Osteoporosis managed by PCP  2. Hormone replacement  therapy (HRT) Will check levels before increasing - Estradiol - Progesterone - estradiol (ESTRACE VAGINAL) 0.1 MG/GM vaginal cream; Place 0.5 Applicatorfuls vaginally 2 (two) times a week. Apply a thin layer on vulva 2 times a week as needed.  Dispense: 42.5 g; Refill: 4  3. Chronic fatigue - CBC - Iron, TIBC and Ferritin Panel  4. Vitamin D deficiency - Vitamin D (25 hydroxy)   Sharnelle Cappelli B WHNP-BC, 1:55 PM 06/03/2023

## 2023-06-04 LAB — IRON,TIBC AND FERRITIN PANEL
%SAT: 28 % (ref 16–45)
Ferritin: 103 ng/mL (ref 16–232)
Iron: 106 ug/dL (ref 45–160)
TIBC: 372 ug/dL (ref 250–450)

## 2023-06-04 LAB — CBC
HCT: 44.2 % (ref 35.0–45.0)
Hemoglobin: 14.2 g/dL (ref 11.7–15.5)
MCH: 29 pg (ref 27.0–33.0)
MCHC: 32.1 g/dL (ref 32.0–36.0)
MCV: 90.4 fL (ref 80.0–100.0)
MPV: 9.9 fL (ref 7.5–12.5)
Platelets: 319 10*3/uL (ref 140–400)
RBC: 4.89 10*6/uL (ref 3.80–5.10)
RDW: 12.2 % (ref 11.0–15.0)
WBC: 6.9 10*3/uL (ref 3.8–10.8)

## 2023-06-04 LAB — PROGESTERONE: Progesterone: 2.7 ng/mL

## 2023-06-04 LAB — VITAMIN D 25 HYDROXY (VIT D DEFICIENCY, FRACTURES): Vit D, 25-Hydroxy: 41 ng/mL (ref 30–100)

## 2023-06-04 LAB — ESTRADIOL: Estradiol: 72 pg/mL

## 2023-06-05 ENCOUNTER — Other Ambulatory Visit

## 2023-06-10 ENCOUNTER — Other Ambulatory Visit: Payer: Self-pay | Admitting: Radiology

## 2023-06-10 DIAGNOSIS — Z79890 Hormone replacement therapy (postmenopausal): Secondary | ICD-10-CM

## 2023-06-10 MED ORDER — PROGESTERONE MICRONIZED 100 MG PO CAPS
100.0000 mg | ORAL_CAPSULE | Freq: Every day | ORAL | 4 refills | Status: AC
Start: 2023-06-10 — End: ?

## 2023-06-10 MED ORDER — ESTRADIOL 0.05 MG/24HR TD PTTW: 1.0000 | MEDICATED_PATCH | TRANSDERMAL | 4 refills | Status: AC

## 2023-06-12 ENCOUNTER — Telehealth: Payer: Self-pay | Admitting: Family Medicine

## 2023-06-12 NOTE — Telephone Encounter (Signed)
 Copied from CRM [id number]. Topic: Appointments - Appointment Scheduling >> Jun 12, 2023 10:34 AM Allyne Areola wrote: Patient/patient representative is calling to schedule an appointment. Refer to attachments for appointment information. Patient was calling to schedule her annual physical with Dr.Blyth;however, she does not have any openings this year, she would like to know if there is any way to fit her on the schedule soon.

## 2023-06-12 NOTE — Telephone Encounter (Signed)
 Pt called and was advise to call back and check Dr Rodrick Clapper sch for an cpe opening.

## 2023-06-29 ENCOUNTER — Other Ambulatory Visit: Payer: Self-pay | Admitting: Family Medicine

## 2023-06-29 NOTE — Telephone Encounter (Signed)
 Called patient to schedule a follow-up appointment and no answer.

## 2023-07-02 ENCOUNTER — Telehealth: Payer: Self-pay | Admitting: Family Medicine

## 2023-07-02 NOTE — Telephone Encounter (Signed)
 Copied from CRM [id number]. Topic: General - Other >> Jul 02, 2023  3:50 PM [place] E wrote: Reason for CRM: Patient called back and stated she missed a call from [facility] in regards to finding an opening for a physical. Callback number for patient is [phone].

## 2023-07-03 ENCOUNTER — Telehealth: Payer: Self-pay | Admitting: Family Medicine

## 2023-07-03 NOTE — Telephone Encounter (Signed)
 I do not see a record of call from [facility] but I do see a note from Janie 4/25. I lvm to call back and discuss.

## 2023-07-03 NOTE — Telephone Encounter (Signed)
 Copied from CRM [id number]. Topic: General - Other >> Jul 02, 2023  3:50 PM [place] E wrote: Reason for CRM: Patient called back and stated she missed a call from [facility] in regards to finding an opening for a physical. Callback number for patient is [phone]. >> Jul 03, 2023  1:46 PM Caliyah H wrote: Patient returned a missed call from today.

## 2023-07-06 ENCOUNTER — Telehealth: Payer: Self-pay | Admitting: Family Medicine

## 2023-07-06 NOTE — Telephone Encounter (Signed)
 Copied from CRM [id number]. Topic: Appointments - Appointment Scheduling >> Jul 06, 2023  3:01 PM Alyse July wrote: Patient/patient representative is calling to schedule an appointment. Refer to attachments for appointment information. >> Jul 06, 2023  3:19 PM Alyse July wrote: Patient return call to the office to be schedule for physical. Attempted to schedule appointment with NP [place] per CAL but unable to do so. Schedule template only showed availability for 2026 nothing for the remainder of 2025. Please contact patient to schedule/advise. Patient has also been added to the waitlist.

## 2023-07-06 NOTE — Telephone Encounter (Signed)
 Will you try and reach out to pt? I have called her back 3 times.

## 2023-07-07 NOTE — Telephone Encounter (Signed)
 Called patient and she scheduled appointment for 09/07/23 @ 8:20 AM.

## 2023-07-31 ENCOUNTER — Ambulatory Visit (HOSPITAL_BASED_OUTPATIENT_CLINIC_OR_DEPARTMENT_OTHER)

## 2023-08-24 ENCOUNTER — Ambulatory Visit (HOSPITAL_BASED_OUTPATIENT_CLINIC_OR_DEPARTMENT_OTHER)
Admission: RE | Admit: 2023-08-24 | Discharge: 2023-08-24 | Disposition: A | Discharge status: Home / Self Care | Source: Ambulatory Visit | Attending: Endocrinology | Admitting: Endocrinology

## 2023-08-24 DIAGNOSIS — E049 Nontoxic goiter, unspecified: Secondary | ICD-10-CM | POA: Diagnosis present

## 2023-09-06 NOTE — Assessment & Plan Note (Signed)
Follows with endocrinology. hgba1c acceptable, minimize simple carbs. Increase exercise as tolerated. Continue current meds 

## 2023-09-06 NOTE — Assessment & Plan Note (Signed)
 On Levothyroxine, continue to monitor

## 2023-09-06 NOTE — Assessment & Plan Note (Signed)
 Encouraged heart healthy diet, increase exercise, avoid trans fats, consider a krill oil cap daily

## 2023-09-06 NOTE — Assessment & Plan Note (Signed)
Stable on current meds, uses Alprazolam infrequently. No changes

## 2023-09-06 NOTE — Assessment & Plan Note (Signed)
 Supplement and monitor

## 2023-09-06 NOTE — Assessment & Plan Note (Signed)
 Encouraged to get adequate exercise, calcium and vitamin d  intake. Tolerating Fosamax but forgets to take it frequently will switch to Actonel  monthly to see if she can manage to remember that better

## 2023-09-07 ENCOUNTER — Ambulatory Visit: Admitting: Family Medicine

## 2023-09-07 ENCOUNTER — Encounter: Payer: Self-pay | Admitting: Family Medicine

## 2023-09-07 VITALS — BP 110/70 | HR 75 | Resp 16 | Ht 61.0 in | Wt 97.8 lb

## 2023-09-07 DIAGNOSIS — E782 Mixed hyperlipidemia: Secondary | ICD-10-CM | POA: Diagnosis not present

## 2023-09-07 DIAGNOSIS — E559 Vitamin D deficiency, unspecified: Secondary | ICD-10-CM

## 2023-09-07 DIAGNOSIS — E538 Deficiency of other specified B group vitamins: Secondary | ICD-10-CM

## 2023-09-07 DIAGNOSIS — F419 Anxiety disorder, unspecified: Secondary | ICD-10-CM

## 2023-09-07 DIAGNOSIS — E119 Type 2 diabetes mellitus without complications: Secondary | ICD-10-CM | POA: Diagnosis not present

## 2023-09-07 DIAGNOSIS — E039 Hypothyroidism, unspecified: Secondary | ICD-10-CM | POA: Diagnosis not present

## 2023-09-07 DIAGNOSIS — M810 Age-related osteoporosis without current pathological fracture: Secondary | ICD-10-CM

## 2023-09-07 MED ORDER — VITAMIN D (ERGOCALCIFEROL) 1.25 MG (50000 UNIT) PO CAPS: 50000.0000 [IU] | ORAL_CAPSULE | ORAL | Status: AC

## 2023-09-07 MED ORDER — ALPRAZOLAM 0.25 MG PO TABS: 0.2500 mg | ORAL_TABLET | Freq: Two times a day (BID) | ORAL | 1 refills | Status: DC | PRN

## 2023-09-07 MED ORDER — ZOLPIDEM TARTRATE 5 MG PO TABS: 2.5000 mg | ORAL_TABLET | Freq: Every evening | ORAL | 1 refills | Status: DC | PRN

## 2023-09-07 MED ORDER — RISEDRONATE SODIUM 150 MG PO TABS: ORAL_TABLET | ORAL | 11 refills | Status: AC

## 2023-09-07 NOTE — Patient Instructions (Addendum)
 Cognitive Behavioral Therapy Insomnia (CBTi) [facility] Helight light helps with sleep  Insomnia Insomnia is a sleep disorder that makes it difficult to fall asleep or stay asleep. Insomnia can cause fatigue, low energy, difficulty concentrating, mood swings, and poor performance at work or school. There are three different ways to classify insomnia: Difficulty falling asleep. Difficulty staying asleep. Waking up too early in the morning. Any type of insomnia can be long-term (chronic) or short-term (acute). Both are common. Short-term insomnia usually lasts for 3 months or less. Chronic insomnia occurs at least three times a week for longer than 3 months. What are the causes? Insomnia may be caused by another condition, situation, or substance, such as: Having certain mental health conditions, such as anxiety and depression. Using caffeine, alcohol, tobacco, or drugs. Having gastrointestinal conditions, such as gastroesophageal reflux disease (GERD). Having certain medical conditions. These include: Asthma. Alzheimer's disease. Stroke. Chronic pain. An overactive thyroid  gland (hyperthyroidism). Other sleep disorders, such as restless legs syndrome and sleep apnea. Menopause. Sometimes, the cause of insomnia may not be known. What increases the risk? Risk factors for insomnia include: Gender. Females are affected more often than males. Age. Insomnia is more common as people get older. Stress and certain medical and mental health conditions. Lack of exercise. Having an irregular work schedule. This may include working night shifts and traveling between different time zones. What are the signs or symptoms? If you have insomnia, the main symptom is having trouble falling asleep or having trouble staying asleep. This may lead to other symptoms, such as: Feeling tired or having low energy. Feeling nervous about going to sleep. Not feeling rested in the  morning. Having trouble concentrating. Feeling irritable, anxious, or depressed. How is this diagnosed? This condition may be diagnosed based on: Your symptoms and medical history. Your health care provider may ask about: Your sleep habits. Any medical conditions you have. Your mental health. A physical exam. How is this treated? Treatment for insomnia depends on the cause. Treatment may focus on treating an underlying condition that is causing the insomnia. Treatment may also include: Medicines to help you sleep. Counseling or therapy. Lifestyle adjustments to help you sleep better. Follow these instructions at home: Eating and drinking  Limit or avoid alcohol, caffeinated beverages, and products that contain nicotine and tobacco, especially close to bedtime. These can disrupt your sleep. Do not eat a large meal or eat spicy foods right before bedtime. This can lead to digestive discomfort that can make it hard for you to sleep. Sleep habits  Keep a sleep diary to help you and your health care provider figure out what could be causing your insomnia. Write down: When you sleep. When you wake up during the night. How well you sleep and how rested you feel the next day. Any side effects of medicines you are taking. What you eat and drink. Make your bedroom a dark, comfortable place where it is easy to fall asleep. Put up shades or blackout curtains to block light from outside. Use a white noise machine to block noise. Keep the temperature cool. Limit screen use before bedtime. This includes: Not watching TV. Not using your smartphone, tablet, or computer. Stick to a routine that includes going to bed and waking up at the same times every day and night. This can help you fall asleep faster. Consider making a quiet activity, such as reading, part of your nighttime routine. Try to avoid taking naps during the day so that  you sleep better at night. Get out of bed if you are still awake  after 15 minutes of trying to sleep. Keep the lights down, but try reading or doing a quiet activity. When you feel sleepy, go back to bed. General instructions Take over-the-counter and prescription medicines only as told by your health care provider. Exercise regularly as told by your health care provider. However, avoid exercising in the hours right before bedtime. Use relaxation techniques to manage stress. Ask your health care provider to suggest some techniques that may work well for you. These may include: Breathing exercises. Routines to release muscle tension. Visualizing peaceful scenes. Make sure that you drive carefully. Do not drive if you feel very sleepy. Keep all follow-up visits. This is important. Contact a health care provider if: You are tired throughout the day. You have trouble in your daily routine due to sleepiness. You continue to have sleep problems, or your sleep problems get worse. Get help right away if: You have thoughts about hurting yourself or someone else. Get help right away if you feel like you may hurt yourself or others, or have thoughts about taking your own life. Go to your nearest emergency room or: Call 911. Call the National Suicide Prevention Lifeline at 202-801-2456 or 988. This is open 24 hours a day. Text the Crisis Text Line at 639-030-9779. Summary Insomnia is a sleep disorder that makes it difficult to fall asleep or stay asleep. Insomnia can be long-term (chronic) or short-term (acute). Treatment for insomnia depends on the cause. Treatment may focus on treating an underlying condition that is causing the insomnia. Keep a sleep diary to help you and your health care provider figure out what could be causing your insomnia. This information is not intended to replace advice given to you by your health care provider. Make sure you discuss any questions you have with your health care provider. Document Revised: 01/14/2021 Document Reviewed:  01/14/2021 Elsevier Patient Education  2024 [employer].

## 2023-09-07 NOTE — Progress Notes (Signed)
 Subjective:    Patient ID: Kelli Barry, female    DOB: Aug 27, 1965, 58 y.o.   MRN: [id number]  Chief Complaint  Patient presents with   Medical Management of Chronic Issues    Patient presents today for a 15 month follow-up.   Quality Metric Gaps    Foot & eye exam, Hep C & HIV screening, pneumococcal, zoster, urine microalbumin, A1C, CMP     HPI Discussed the use of AI scribe software for clinical note transcription with the patient, who gave verbal consent to proceed.  History of Present Illness Kelli Barry is a 58 year old female who presents with sleep disturbances and concerns about glucose monitoring.  She experiences ongoing sleep disturbances, characterized by waking up at 3 or 4 AM. She has tried magnesium and melatonin, which help her fall asleep but not stay asleep. She wants an occasional aid to help her sleep after several nights of poor rest.  She has been monitoring her glucose levels using a glucose monitor she purchased herself. Her morning glucose levels are slightly over 100 mg/dL, despite eating well and exercising. She mentions a specific instance where her glucose spiked to 160 mg/dL after consuming whole grain toast and avocado. She is concerned about maintaining her glucose levels below 100 mg/dL.Her night time numbers hover around 105.  Her current medications include alprazolam , which she uses rarely, and Actonel . She also takes vitamin D  every other week and a multivitamin daily. She has a history of high cholesterol, which she manages with rosuvastatin three times a week.    Past Medical History:  Diagnosis Date   Abdominal pain 08/06/2014   Acute bronchitis 12/15/2010   Allergy    seasonal   Anxiety 10/16/2016   Diabetes mellitus without complication (HCC)    diet controlled   Diabetes type 2, controlled (HCC) 08/06/2014   Follows with Dr Tommas   Hyperlipidemia    Hypothyroidism 08/06/2014   Osteoporosis    Osteoporosis 12/05/2009    Qualifier: Diagnosis of  By: Domenica MD, Cesare Sumlin     Preventative health care 08/06/2014   Rash 10/16/2016    Past Surgical History:  Procedure Laterality Date   NO PAST SURGERIES      Family History  Problem Relation Age of Onset   Hypertension Mother    Fibroids Mother        uterine   Hyperlipidemia Mother    Stroke Mother    Atrial fibrillation Mother    Dementia Mother    Diabetes Father        DM II   Hypertension Father    Melanoma Father    Dementia Father    Memory loss Father    Parkinsonism Father    Parkinson's disease Father        lewy body   Ovarian cysts Sister    Crohn's disease Son    Heart disease Maternal Aunt 91       MI   Heart disease Maternal Uncle 5       ish   Heart disease Maternal Uncle 80       ish, MI   Cancer Maternal Grandmother        colon   Coronary artery disease Maternal Grandmother    Heart attack Maternal Grandmother    Colon cancer Maternal Grandmother 75   Heart disease Maternal Grandfather        MI at roughly 49   Stroke Paternal Grandmother    Dementia Paternal Grandmother  Cancer Paternal Grandfather        Lung   Emphysema Paternal Grandfather     Social History   Socioeconomic History   Marital status: Divorced    Spouse name: Not on file   Number of children: Not on file   Years of education: Not on file   Highest education level: Not on file  Occupational History   Not on file  Tobacco Use   Smoking status: Never    Passive exposure: Never   Smokeless tobacco: Never  Vaping Use   Vaping status: Never Used  Substance and Sexual Activity   Alcohol use: Yes    Alcohol/week: 2.0 standard drinks of alcohol    Types: 2 Glasses of wine per week   Drug use: No   Sexual activity: Not Currently    Partners: Male    Birth control/protection: Post-menopausal    Comment: menarche 58yo, 1st intercourse- 24, partner- less than 5  Other Topics Concern   Not on file  Social History Narrative   Not on file    Social Drivers of Health   Financial Resource Strain: Not on file  Food Insecurity: Not on file  Transportation Needs: Not on file  Physical Activity: Not on file  Stress: Not on file  Social Connections: Unknown (06/30/2021)   Received from [facility]   Social Network    Social Network: Not on file  Intimate Partner Violence: Unknown (05/21/2021)   Received from [facility]   HITS    Physically Hurt: Not on file    Insult or Talk Down To: Not on file    Threaten Physical Harm: Not on file    Scream or Curse: Not on file    Outpatient Medications Prior to Visit  Medication Sig Dispense Refill   albuterol  (PROVENTIL ) (2.5 MG/3ML) 0.083% nebulizer solution 3 ml as needed Inhalation q 4-6 hours prn cough/wheeze for 90 days     albuterol  (VENTOLIN  HFA) 108 (90 Base) MCG/ACT inhaler Inhale 2 puffs into the lungs every 4 (four) hours as needed.     Azelastine HCl 137 MCG/SPRAY SOLN Place 2 sprays into both nostrils 2 (two) times daily.     bimatoprost (LATISSE) 0.03 % ophthalmic solution      Blood Glucose Monitoring Suppl (ONETOUCH VERIO REFLECT) w/Device KIT      desonide (DESOWEN) 0.05 % ointment Apply 1 Application topically 2 (two) times daily.     EPINEPHrine 0.3 mg/0.3 mL IJ SOAJ injection SMARTSIG:injection IM As Directed     estradiol  (ESTRACE  VAGINAL) 0.1 MG/GM vaginal cream Place 0.5 Applicatorfuls vaginally 2 (two) times a week. Apply a thin layer on vulva 2 times a week as needed. 42.5 g 4   estradiol  (VIVELLE -DOT) 0.05 MG/24HR patch Place 1 patch (0.05 mg total) onto the skin 2 (two) times a week. 24 patch 4   fexofenadine (ALLEGRA ALLERGY) 180 MG tablet 1 tablet Swallow whole with water; do not take with fruit juices. Orally Once a day for 30 day(s)     fluticasone  (FLONASE) 50 MCG/ACT nasal spray Place 1 spray into both nostrils daily.     levothyroxine (SYNTHROID, LEVOTHROID) 75 MCG tablet Take 75 mcg by mouth daily before breakfast.     Multiple Vitamin  (MULTIVITAMIN PO) Take by mouth.     ONE TOUCH ULTRA TEST test strip 1 strip.  4   progesterone  (PROMETRIUM ) 100 MG capsule Take 1 capsule (100 mg total) by mouth at bedtime. 90 capsule 4   rosuvastatin (CRESTOR)  5 MG tablet Take 5 mg by mouth 3 (three) times a week.     tiZANidine  (ZANAFLEX ) 2 MG tablet Take 0.5-2 tablets (1-4 mg total) by mouth every 8 (eight) hours as needed for muscle spasms. 20 tablet 0   ALPRAZolam  (XANAX ) 0.25 MG tablet Take 1 tablet (0.25 mg total) by mouth 2 (two) times daily as needed for anxiety. 30 tablet 0   risedronate  (ACTONEL ) 150 MG tablet TAKE 1 TABLET EVERY 30 DAYS WITH WATER ON EMPTY STOMACH, NOTHING BY MOUTH OR LIE DOWN FOR NEXT 30 MINUTES. 1 tablet 0   Vitamin D , Ergocalciferol , (DRISDOL ) 50000 units CAPS capsule Take 50,000 Units by mouth every 7 (seven) days.     No facility-administered medications prior to visit.    Allergies  Allergen Reactions   Codeine Nausea And Vomiting   Penicillins Nausea And Vomiting    Review of Systems  Constitutional:  Negative for fever and malaise/fatigue.  HENT:  Negative for congestion.   Eyes:  Negative for blurred vision.  Respiratory:  Negative for shortness of breath.   Cardiovascular:  Negative for chest pain, palpitations and leg swelling.  Gastrointestinal:  Negative for abdominal pain, blood in stool and nausea.  Genitourinary:  Negative for dysuria and frequency.  Musculoskeletal:  Negative for falls.  Skin:  Negative for rash.  Neurological:  Negative for dizziness, loss of consciousness and headaches.  Endo/Heme/Allergies:  Negative for environmental allergies.  Psychiatric/Behavioral:  Negative for depression. The patient is nervous/anxious and has insomnia.        Objective:    Physical Exam Constitutional:      General: She is not in acute distress.    Appearance: Normal appearance. She is well-developed. She is not toxic-appearing.  HENT:     Head: Normocephalic and atraumatic.      Right Ear: External ear normal.     Left Ear: External ear normal.     Nose: Nose normal.  Eyes:     General:        Right eye: No discharge.        Left eye: No discharge.     Conjunctiva/sclera: Conjunctivae normal.  Neck:     Thyroid : No thyromegaly.  Cardiovascular:     Rate and Rhythm: Normal rate and regular rhythm.     Heart sounds: Normal heart sounds. No murmur heard. Pulmonary:     Effort: Pulmonary effort is normal. No respiratory distress.     Breath sounds: Normal breath sounds.  Abdominal:     General: Bowel sounds are normal.     Palpations: Abdomen is soft.     Tenderness: There is no abdominal tenderness. There is no guarding.  Musculoskeletal:        General: Normal range of motion.     Cervical back: Neck supple.  Lymphadenopathy:     Cervical: No cervical adenopathy.  Skin:    General: Skin is warm and dry.  Neurological:     Mental Status: She is alert and oriented to person, place, and time.  Psychiatric:        Mood and Affect: Mood normal.        Behavior: Behavior normal.        Thought Content: Thought content normal.        Judgment: Judgment normal.     BP 110/70   Pulse 75   Resp 16   Ht 5' 1 (1.549 m)   Wt 97 lb 12.8 oz (44.4 kg)   LMP 01/14/2011  Comment: not sexually active  SpO2 96%   BMI 18.48 kg/m  Wt Readings from Last 3 Encounters:  09/07/23 97 lb 12.8 oz (44.4 kg)  06/03/23 95 lb 3.2 oz (43.2 kg)  06/09/22 104 lb 6.4 oz (47.4 kg)    Diabetic Foot Exam - Simple   No data filed    Lab Results  Component Value Date   WBC 6.9 06/03/2023   HGB 14.2 06/03/2023   HCT 44.2 06/03/2023   PLT 319 06/03/2023   GLUCOSE 90 01/16/2017   CHOL 217 (A) 05/12/2020   TRIG 87 05/12/2020   HDL 86 (A) 05/12/2020   LDLCALC 114 05/12/2020   ALT 9 01/16/2017   AST 14 01/16/2017   NA 142 05/12/2020   K 4.5 05/12/2020   CL 103 05/12/2020   CREATININE 0.79 01/16/2017   BUN 12 05/12/2020   CO2 21 05/12/2020   TSH 1.22 05/12/2020    HGBA1C 5.8 05/12/2020   MICROALBUR 5.3 05/12/2020    Lab Results  Component Value Date   TSH 1.22 05/12/2020   Lab Results  Component Value Date   WBC 6.9 06/03/2023   HGB 14.2 06/03/2023   HCT 44.2 06/03/2023   MCV 90.4 06/03/2023   PLT 319 06/03/2023   Lab Results  Component Value Date   NA 142 05/12/2020   K 4.5 05/12/2020   CO2 21 05/12/2020   GLUCOSE 90 01/16/2017   BUN 12 05/12/2020   CREATININE 0.79 01/16/2017   BILITOT 0.6 01/16/2017   ALKPHOS 60 01/16/2017   AST 14 01/16/2017   ALT 9 01/16/2017   PROT 7.2 01/16/2017   ALBUMIN 4.4 01/16/2017   CALCIUM 9.4 05/12/2020   EGFR 96.0 05/14/2022   GFR 80.65 01/16/2017   Lab Results  Component Value Date   CHOL 217 (A) 05/12/2020   Lab Results  Component Value Date   HDL 86 (A) 05/12/2020   Lab Results  Component Value Date   LDLCALC 114 05/12/2020   Lab Results  Component Value Date   TRIG 87 05/12/2020   Lab Results  Component Value Date   CHOLHDL 3 01/16/2017   Lab Results  Component Value Date   HGBA1C 5.8 05/12/2020       Assessment & Plan:  Controlled type 2 diabetes mellitus without complication, without long-term current use of insulin (HCC) Assessment & Plan: Follows with endocrinology. hgba1c acceptable, minimize simple carbs. Increase exercise as tolerated. Continue current meds    Hypothyroidism, unspecified type Assessment & Plan: On Levothyroxine, continue to monitor    Mixed hyperlipidemia Assessment & Plan: Encouraged heart healthy diet, increase exercise, avoid trans fats, consider a krill oil cap daily.    Vitamin B12 deficiency Assessment & Plan: Supplement and monitor    Vitamin D  deficiency Assessment & Plan: Supplement and monitor    Age-related osteoporosis without current pathological fracture Assessment & Plan: Encouraged to get adequate exercise, calcium and vitamin d  intake. Tolerating Fosamax but forgets to take it frequently will switch to Actonel   monthly to see if she can manage to remember that better   Anxiety Assessment & Plan: Stable on current meds, uses Alprazolam  infrequently. No changes  Orders: -     Drug Monitoring Panel [id number] , Urine  Other orders -     Zolpidem  Tartrate; Take 0.5-2 tablets (2.5-10 mg total) by mouth at bedtime as needed for sleep.  Dispense: 15 tablet; Refill: 1 -     ALPRAZolam ; Take 1 tablet (0.25 mg total) by mouth 2 (  two) times daily as needed for anxiety.  Dispense: 30 tablet; Refill: 1 -     Risedronate  Sodium; TAKE 1 TABLET EVERY 30 DAYS WITH WATER ON EMPTY STOMACH, NOTHING BY MOUTH OR LIE DOWN FOR NEXT 30 MINUTES.  Dispense: 1 tablet; Refill: 11 -     Vitamin D  (Ergocalciferol ); Take 1 capsule (50,000 Units total) by mouth every 14 (fourteen) days.    Assessment and Plan Assessment & Plan Insomnia Chronic sleep difficulty with early morning awakenings. Limited success with magnesium and melatonin. Discussed risks of long-term sleep medication use. Recommended CBTI as more effective than medications. Suggested [facility] for CBTI and discussed red light device for melatonin production. - Recommend CBTI using the [facility] app. - Prescribe zolpidem  5 mg, start with 2.5 mg, adjust as needed, max 10 mg. Caution against mixing with alprazolam . - Discuss use of red light device for melatonin production.  Type 2 Diabetes Mellitus without complication Monitoring blood glucose with personal monitor. Morning levels slightly above 100 mg/dL. Reassured levels below 110 mg/dL are acceptable. Advised on dietary management and exercise. Discussed age-related glucose metabolism changes. - Continue dietary management and exercise. - Monitor blood glucose levels, especially postprandial, to identify dietary triggers.  Mixed Hyperlipidemia On rosuvastatin 5 mg three times a week with improved cholesterol levels. Initially resistant due to dementia concerns but positive results noted. Discussed genetic factors and  post-menopausal cholesterol management challenges. - Continue rosuvastatin 5 mg three times a week.  Vitamin D  Deficiency Taking 50,000 IU vitamin D  biweekly. Discussed study suggesting better bone strength with daily dosing. Considered daily regimen based on preference and response. - Consider switching to daily vitamin D  regimen, such as 5,000 IU daily.  General Health Maintenance Discussed shingles vaccine benefits, including 95% risk reduction and prevention of complications. Explained significant improvement with first dose, additional benefit with second dose. - Administer first dose of shingles vaccine, second dose in 2-6 months. - Recommend receiving vaccine at clinic for immediate emergency care access if needed.  Follow-up Plan to follow up in six months for routine evaluation and management assessment. Ensure lab results from specialists are uploaded. - Schedule follow-up appointment in six months. - Ensure lab results from endocrinologist and gynecologist are uploaded.     Harlene Horton, MD

## 2023-09-09 ENCOUNTER — Ambulatory Visit: Payer: Self-pay | Admitting: Family Medicine

## 2023-09-09 LAB — DM TEMPLATE

## 2023-09-09 LAB — DRUG MONITORING PANEL 376104, URINE
Amphetamines: NEGATIVE ng/mL (ref <500)
Barbiturates: NEGATIVE ng/mL (ref <300)
Benzodiazepines: NEGATIVE ng/mL (ref <100)
Cocaine Metabolite: NEGATIVE ng/mL (ref <150)
Desmethyltramadol: NEGATIVE ng/mL (ref <100)
Opiates: NEGATIVE ng/mL (ref <100)
Oxycodone: NEGATIVE ng/mL (ref <100)
Tramadol: NEGATIVE ng/mL (ref <100)

## 2024-03-09 DIAGNOSIS — G4700 Insomnia, unspecified: Secondary | ICD-10-CM | POA: Insufficient documentation

## 2024-03-09 DIAGNOSIS — Z0000 Encounter for general adult medical examination without abnormal findings: Secondary | ICD-10-CM | POA: Insufficient documentation

## 2024-03-09 NOTE — Assessment & Plan Note (Signed)
 Supplement and monitor

## 2024-03-09 NOTE — Assessment & Plan Note (Signed)
 Tolerating statin. Taking rosuvastatin 3 times per week. Encourage heart healthy diet such as MIND or DASH diet, increase exercise, avoid trans fats, simple carbohydrates and processed foods, consider a krill or fish or flaxseed oil cap daily.

## 2024-03-09 NOTE — Progress Notes (Unsigned)
 "  Subjective:     Patient ID: Kelli Barry, female    DOB: [birth year], 59 y.o.   MRN: [id number]  No chief complaint on file.   HPI  Discussed the use of AI scribe software for clinical note transcription with the patient, who gave verbal consent to proceed.  History of Present Illness        Kelli Barry is a 58 female presents for CPE.  Started on zolpidem  5 mg, start with 2.5 mg, adjust as needed, max 10 mg--last visit-- how much taking?  Anxiety- alprazolam  0.25 mg BID prn for anxiety  Hypothyroidism-Synthroid 75 mcg daily     Health Maintenance Due  Topic Date Due   FOOT EXAM  Never done   OPHTHALMOLOGY EXAM  Never done   HIV Screening  Never done   Diabetic kidney evaluation - Urine ACR  Never done   Hepatitis C Screening  Never done   Pneumococcal Vaccine: 50+ Years (1 of 2 - PCV) Never done   Zoster Vaccines- Shingrix (1 of 2) Never done   HEMOGLOBIN A1C  11/14/2022   Diabetic kidney evaluation - eGFR measurement  05/14/2023   Influenza Vaccine  09/18/2023   COVID-19 Vaccine (4 - 2025-26 season) 10/19/2023   Colonoscopy  03/03/2024    Past Medical History:  Diagnosis Date   Abdominal pain 08/06/2014   Acute bronchitis 12/15/2010   Allergy    seasonal   Anxiety 10/16/2016   Diabetes mellitus without complication (HCC)    diet controlled   Diabetes type 2, controlled (HCC) 08/06/2014   Follows with Dr Tommas   Hyperlipidemia    Hypothyroidism 08/06/2014   Osteoporosis    Osteoporosis 12/05/2009   Qualifier: Diagnosis of  By: Domenica MD, Stacey     Preventative health care 08/06/2014   Rash 10/16/2016    Past Surgical History:  Procedure Laterality Date   NO PAST SURGERIES      Family History  Problem Relation Age of Onset   Hypertension Mother    Fibroids Mother        uterine   Hyperlipidemia Mother    Stroke Mother    Atrial fibrillation Mother    Dementia Mother    Diabetes Father        DM II   Hypertension Father    Melanoma Father     Dementia Father    Memory loss Father    Parkinsonism Father    Parkinson's disease Father        lewy body   Ovarian cysts Sister    Crohn's disease Son    Heart disease Maternal Aunt 66       MI   Heart disease Maternal Uncle 48       ish   Heart disease Maternal Uncle 17       ish, MI   Cancer Maternal Grandmother        colon   Coronary artery disease Maternal Grandmother    Heart attack Maternal Grandmother    Colon cancer Maternal Grandmother 75   Heart disease Maternal Grandfather        MI at roughly 60   Stroke Paternal Grandmother    Dementia Paternal Grandmother    Cancer Paternal Grandfather        Lung   Emphysema Paternal Grandfather     Social History   Socioeconomic History   Marital status: Divorced    Spouse name: Not on file   Number of children: Not on file  Years of education: Not on file   Highest education level: Not on file  Occupational History   Not on file  Tobacco Use   Smoking status: Never    Passive exposure: Never   Smokeless tobacco: Never  Vaping Use   Vaping status: Never Used  Substance and Sexual Activity   Alcohol use: Yes    Alcohol/week: 2.0 standard drinks of alcohol    Types: 2 Glasses of wine per week   Drug use: No   Sexual activity: Not Currently    Partners: Male    Birth control/protection: Post-menopausal    Comment: menarche 59yo, 1st intercourse- 45, partner- less than 5  Other Topics Concern   Not on file  Social History Narrative   Not on file   Social Drivers of Health   Tobacco Use: Low Risk (09/07/2023)   Patient History    Smoking Tobacco Use: Never    Smokeless Tobacco Use: Never    Passive Exposure: Never  Financial Resource Strain: Not on file  Food Insecurity: Not on file  Transportation Needs: Not on file  Physical Activity: Not on file  Stress: Not on file  Social Connections: Unknown (06/30/2021)   Received from [facility]   Social Network    Social Network: Not on file   Intimate Partner Violence: Unknown (05/21/2021)   Received from [facility]   HITS    Physically Hurt: Not on file    Insult or Talk Down To: Not on file    Threaten Physical Harm: Not on file    Scream or Curse: Not on file  Depression (PHQ2-9): Low Risk (06/03/2023)   Depression (PHQ2-9)    PHQ-2 Score: 0  Alcohol Screen: Not on file  Housing: Not on file  Utilities: Not on file  Health Literacy: Not on file    Outpatient Medications Prior to Visit  Medication Sig Dispense Refill   albuterol  (PROVENTIL ) (2.5 MG/3ML) 0.083% nebulizer solution 3 ml as needed Inhalation q 4-6 hours prn cough/wheeze for 90 days     albuterol  (VENTOLIN  HFA) 108 (90 Base) MCG/ACT inhaler Inhale 2 puffs into the lungs every 4 (four) hours as needed.     ALPRAZolam  (XANAX ) 0.25 MG tablet Take 1 tablet (0.25 mg total) by mouth 2 (two) times daily as needed for anxiety. 30 tablet 1   Azelastine HCl 137 MCG/SPRAY SOLN Place 2 sprays into both nostrils 2 (two) times daily.     bimatoprost (LATISSE) 0.03 % ophthalmic solution      Blood Glucose Monitoring Suppl (ONETOUCH VERIO REFLECT) w/Device KIT      desonide (DESOWEN) 0.05 % ointment Apply 1 Application topically 2 (two) times daily.     EPINEPHrine 0.3 mg/0.3 mL IJ SOAJ injection SMARTSIG:injection IM As Directed     estradiol  (ESTRACE  VAGINAL) 0.1 MG/GM vaginal cream Place 0.5 Applicatorfuls vaginally 2 (two) times a week. Apply a thin layer on vulva 2 times a week as needed. 42.5 g 4   estradiol  (VIVELLE -DOT) 0.05 MG/24HR patch Place 1 patch (0.05 mg total) onto the skin 2 (two) times a week. 24 patch 4   fexofenadine (ALLEGRA ALLERGY) 180 MG tablet 1 tablet Swallow whole with water; do not take with fruit juices. Orally Once a day for 30 day(s)     fluticasone  (FLONASE) 50 MCG/ACT nasal spray Place 1 spray into both nostrils daily.     levothyroxine (SYNTHROID, LEVOTHROID) 75 MCG tablet Take 75 mcg by mouth daily before breakfast.     Multiple  Vitamin  (MULTIVITAMIN PO) Take by mouth.     ONE TOUCH ULTRA TEST test strip 1 strip.  4   progesterone  (PROMETRIUM ) 100 MG capsule Take 1 capsule (100 mg total) by mouth at bedtime. 90 capsule 4   risedronate  (ACTONEL ) 150 MG tablet TAKE 1 TABLET EVERY 30 DAYS WITH WATER ON EMPTY STOMACH, NOTHING BY MOUTH OR LIE DOWN FOR NEXT 30 MINUTES. 1 tablet 11   rosuvastatin (CRESTOR) 5 MG tablet Take 5 mg by mouth 3 (three) times a week.     tiZANidine  (ZANAFLEX ) 2 MG tablet Take 0.5-2 tablets (1-4 mg total) by mouth every 8 (eight) hours as needed for muscle spasms. 20 tablet 0   Vitamin D , Ergocalciferol , (DRISDOL ) 1.25 MG (50000 UNIT) CAPS capsule Take 1 capsule (50,000 Units total) by mouth every 14 (fourteen) days.     zolpidem  (AMBIEN ) 5 MG tablet Take 0.5-2 tablets (2.5-10 mg total) by mouth at bedtime as needed for sleep. 15 tablet 1   No facility-administered medications prior to visit.    Allergies[1]  ROS     Objective:    Physical Exam Constitutional:      General: She is not in acute distress.    Appearance: She is not ill-appearing, toxic-appearing or diaphoretic.  HENT:     Head: Normocephalic and atraumatic.     Right Ear: Tympanic membrane, ear canal and external ear normal.     Left Ear: Tympanic membrane, ear canal and external ear normal.     Nose: Nose normal. No congestion.     Mouth/Throat:     Mouth: Mucous membranes are moist.     Pharynx: Oropharynx is clear.  Eyes:     Extraocular Movements: Extraocular movements intact.     Right eye: Normal extraocular motion.     Left eye: Normal extraocular motion.     Conjunctiva/sclera: Conjunctivae normal.     Pupils: Pupils are equal, round, and reactive to light.  Neck:     Thyroid : No thyroid  mass or thyromegaly.     Vascular: No carotid bruit or JVD.  Cardiovascular:     Rate and Rhythm: Normal rate and regular rhythm.     Pulses: Normal pulses.     Heart sounds: Normal heart sounds, S1 normal and S2 normal. No murmur  heard.    No friction rub. No gallop.  Pulmonary:     Effort: Pulmonary effort is normal. No respiratory distress.     Breath sounds: Normal breath sounds.  Abdominal:     General: Bowel sounds are normal. There is no distension.     Palpations: Abdomen is soft.     Tenderness: There is no abdominal tenderness. There is no guarding.  Musculoskeletal:        General: Normal range of motion.     Cervical back: Full passive range of motion without pain and normal range of motion. No edema or erythema.     Right lower leg: No edema.     Left lower leg: No edema.  Lymphadenopathy:     Cervical: No cervical adenopathy.  Skin:    General: Skin is warm and dry.     Capillary Refill: Capillary refill takes less than 2 seconds.  Neurological:     General: No focal deficit present.     Mental Status: She is alert and oriented to person, place, and time.     Cranial Nerves: No cranial nerve deficit.     Motor: No weakness.     Coordination:  Coordination normal.     Gait: Gait normal.     Deep Tendon Reflexes: Reflexes normal.  Psychiatric:        Mood and Affect: Mood normal.        Behavior: Behavior normal.        Thought Content: Thought content normal.      LMP 01/14/2011 Comment: not sexually active Wt Readings from Last 3 Encounters:  09/07/23 97 lb 12.8 oz (44.4 kg)  06/03/23 95 lb 3.2 oz (43.2 kg)  06/09/22 104 lb 6.4 oz (47.4 kg)       Assessment & Plan:   Problem List Items Addressed This Visit       Endocrine   Diabetes type 2, controlled (HCC)   Follows with endocrinology. hgba1c acceptable, minimize simple carbs. Increase exercise as tolerated. Continue current meds       Hypothyroidism   On Levothyroxine, continue to monitor         Musculoskeletal and Integument   Osteoporosis   Managed with Actonel  monthly. Taking as prescribed. Encouraged daily intake of calcium and vitamin D . Advised engaging in regular exercise as tolerated to support overall bone  and CV health.         Other   Annual physical exam - Primary   Patient encouraged to maintain heart healthy diet, regular exercise, adequate sleep. Consider daily probiotics. Take medications as prescribed.        Anxiety   Stable on current meds. Use of Alprazolam  infrequently. No changes.      Insomnia   Stable on zolpidem  prn. Do not mix with alprazolam . Encouraged good sleep hygiene such as dark, quiet room. No blue/green glowing lights such as computer screens in bedroom. No alcohol or stimulants in evening. Cut down on caffeine as able. Regular exercise is helpful but not just prior to bed time.           Mixed hyperlipidemia   Tolerating statin. Taking rosuvastatin 3 times per week. Encourage heart healthy diet such as MIND or DASH diet, increase exercise, avoid trans fats, simple carbohydrates and processed foods, consider a krill or fish or flaxseed oil cap daily.        Vitamin B12 deficiency   Supplement and monitor       Vitamin D  deficiency   Supplement and monitor.       FU 6 months  I am having Kelli Barry maintain her ONE TOUCH ULTRA TEST, levothyroxine, rosuvastatin, bimatoprost, OneTouch Verio Reflect, desonide, Multiple Vitamin (MULTIVITAMIN PO), tiZANidine , albuterol , albuterol , Azelastine HCl, EPINEPHrine, fexofenadine, fluticasone , estradiol , estradiol , progesterone , zolpidem , ALPRAZolam , risedronate , and Vitamin D  (Ergocalciferol ).  No orders of the defined types were placed in this encounter.     [1]  Allergies Allergen Reactions   Codeine Nausea And Vomiting   Penicillins Nausea And Vomiting   "

## 2024-03-09 NOTE — Assessment & Plan Note (Signed)
 On Levothyroxine, continue to monitor

## 2024-03-09 NOTE — Assessment & Plan Note (Signed)
 Stable on zolpidem  prn. Do not mix with alprazolam . Encouraged good sleep hygiene such as dark, quiet room. No blue/green glowing lights such as computer screens in bedroom. No alcohol or stimulants in evening. Cut down on caffeine as able. Regular exercise is helpful but not just prior to bed time.

## 2024-03-09 NOTE — Assessment & Plan Note (Addendum)
 Stable on current meds. Use of Alprazolam  infrequently. No changes. Rx Refilled UDS: 09/07/2023

## 2024-03-09 NOTE — Assessment & Plan Note (Signed)
Follows with endocrinology. hgba1c acceptable, minimize simple carbs. Increase exercise as tolerated. Continue current meds 

## 2024-03-09 NOTE — Assessment & Plan Note (Signed)
 Patient encouraged to maintain heart healthy diet, regular exercise, adequate sleep. Consider daily probiotics. Take medications as prescribed

## 2024-03-09 NOTE — Assessment & Plan Note (Signed)
 Managed with Actonel  monthly. Taking as prescribed. Encouraged daily intake of calcium and vitamin D . Advised engaging in regular exercise as tolerated to support overall bone and CV health.

## 2024-03-10 ENCOUNTER — Ambulatory Visit: Payer: Self-pay | Admitting: Student

## 2024-03-10 ENCOUNTER — Encounter: Payer: Self-pay | Admitting: Student

## 2024-03-10 VITALS — BP 96/55 | HR 70 | Temp 97.9°F | Resp 15 | Ht 61.0 in | Wt 109.8 lb

## 2024-03-10 DIAGNOSIS — G4700 Insomnia, unspecified: Secondary | ICD-10-CM | POA: Diagnosis not present

## 2024-03-10 DIAGNOSIS — Z0000 Encounter for general adult medical examination without abnormal findings: Secondary | ICD-10-CM

## 2024-03-10 DIAGNOSIS — E538 Deficiency of other specified B group vitamins: Secondary | ICD-10-CM | POA: Diagnosis not present

## 2024-03-10 DIAGNOSIS — E559 Vitamin D deficiency, unspecified: Secondary | ICD-10-CM

## 2024-03-10 DIAGNOSIS — E119 Type 2 diabetes mellitus without complications: Secondary | ICD-10-CM

## 2024-03-10 DIAGNOSIS — E039 Hypothyroidism, unspecified: Secondary | ICD-10-CM | POA: Diagnosis not present

## 2024-03-10 DIAGNOSIS — F419 Anxiety disorder, unspecified: Secondary | ICD-10-CM | POA: Diagnosis not present

## 2024-03-10 DIAGNOSIS — Z79890 Hormone replacement therapy (postmenopausal): Secondary | ICD-10-CM | POA: Diagnosis not present

## 2024-03-10 DIAGNOSIS — M810 Age-related osteoporosis without current pathological fracture: Secondary | ICD-10-CM | POA: Diagnosis not present

## 2024-03-10 DIAGNOSIS — E782 Mixed hyperlipidemia: Secondary | ICD-10-CM | POA: Diagnosis not present

## 2024-03-10 MED ORDER — ZOLPIDEM TARTRATE 5 MG PO TABS: 2.5000 mg | ORAL_TABLET | Freq: Every evening | ORAL | 1 refills | Status: AC | PRN

## 2024-03-10 MED ORDER — ALPRAZOLAM 0.25 MG PO TABS: 0.2500 mg | ORAL_TABLET | Freq: Two times a day (BID) | ORAL | 1 refills | Status: AC | PRN

## 2024-03-10 NOTE — Addendum Note (Signed)
 Addended by: WHEELER HARLENE CROME on: 03/10/2024 09:16 AM   Modules accepted: Orders, Level of Service

## 2024-03-10 NOTE — Assessment & Plan Note (Signed)
 Managed by GYN

## 2024-09-07 ENCOUNTER — Ambulatory Visit: Admitting: Student
